# Patient Record
Sex: Male | Born: 1940
Health system: Southern US, Community
[De-identification: ages and names within clinical notes are randomized; demographics above are authoritative.]

## PROBLEM LIST (undated history)

## (undated) DIAGNOSIS — E785 Hyperlipidemia, unspecified: Secondary | ICD-10-CM

## (undated) DIAGNOSIS — E538 Deficiency of other specified B group vitamins: Secondary | ICD-10-CM

## (undated) DIAGNOSIS — K5790 Diverticulosis of intestine, part unspecified, without perforation or abscess without bleeding: Secondary | ICD-10-CM

## (undated) DIAGNOSIS — R0989 Other specified symptoms and signs involving the circulatory and respiratory systems: Secondary | ICD-10-CM

## (undated) DIAGNOSIS — D239 Other benign neoplasm of skin, unspecified: Secondary | ICD-10-CM

## (undated) DIAGNOSIS — I2510 Atherosclerotic heart disease of native coronary artery without angina pectoris: Secondary | ICD-10-CM

## (undated) DIAGNOSIS — K5520 Angiodysplasia of colon without hemorrhage: Secondary | ICD-10-CM

## (undated) DIAGNOSIS — D229 Melanocytic nevi, unspecified: Secondary | ICD-10-CM

## (undated) DIAGNOSIS — E119 Type 2 diabetes mellitus without complications: Secondary | ICD-10-CM

## (undated) DIAGNOSIS — G43109 Migraine with aura, not intractable, without status migrainosus: Secondary | ICD-10-CM

## (undated) DIAGNOSIS — I10 Essential (primary) hypertension: Secondary | ICD-10-CM

## (undated) DIAGNOSIS — I219 Acute myocardial infarction, unspecified: Secondary | ICD-10-CM

## (undated) DIAGNOSIS — T7840XA Allergy, unspecified, initial encounter: Secondary | ICD-10-CM

## (undated) HISTORY — DX: Other specified symptoms and signs involving the circulatory and respiratory systems: R09.89

## (undated) HISTORY — DX: Atherosclerotic heart disease of native coronary artery without angina pectoris: I25.10

## (undated) HISTORY — DX: Migraine with aura, not intractable, without status migrainosus: G43.109

## (undated) HISTORY — DX: Other benign neoplasm of skin, unspecified: D23.9

## (undated) HISTORY — DX: Angiodysplasia of colon without hemorrhage: K55.20

## (undated) HISTORY — PX: CORONARY ARTERY BYPASS GRAFT: SHX141

## (undated) HISTORY — DX: Hyperlipidemia, unspecified: E78.5

## (undated) HISTORY — DX: Deficiency of other specified B group vitamins: E53.8

## (undated) HISTORY — PX: COLONOSCOPY: SHX174

## (undated) HISTORY — DX: Acute myocardial infarction, unspecified: I21.9

## (undated) HISTORY — DX: Allergy, unspecified, initial encounter: T78.40XA

## (undated) HISTORY — PX: CATARACT EXTRACTION: SUR2

## (undated) HISTORY — DX: Diverticulosis of intestine, part unspecified, without perforation or abscess without bleeding: K57.90

## (undated) HISTORY — DX: Essential (primary) hypertension: I10

## (undated) HISTORY — DX: Melanocytic nevi, unspecified: D22.9

---

## 1998-12-21 ENCOUNTER — Encounter: Payer: Self-pay | Admitting: Thoracic Surgery (Cardiothoracic Vascular Surgery)

## 1998-12-21 ENCOUNTER — Inpatient Hospital Stay (HOSPITAL_COMMUNITY): Admission: RE | Admit: 1998-12-21 | Discharge: 1999-01-01 | Payer: Self-pay | Admitting: Interventional Cardiology

## 1998-12-28 ENCOUNTER — Encounter: Payer: Self-pay | Admitting: Thoracic Surgery (Cardiothoracic Vascular Surgery)

## 1998-12-29 ENCOUNTER — Encounter: Payer: Self-pay | Admitting: Thoracic Surgery (Cardiothoracic Vascular Surgery)

## 1998-12-30 ENCOUNTER — Encounter: Payer: Self-pay | Admitting: Thoracic Surgery (Cardiothoracic Vascular Surgery)

## 1999-02-01 ENCOUNTER — Encounter (HOSPITAL_COMMUNITY): Admission: RE | Admit: 1999-02-01 | Discharge: 1999-05-02 | Payer: Self-pay | Admitting: Family Medicine

## 2000-01-31 DIAGNOSIS — I219 Acute myocardial infarction, unspecified: Secondary | ICD-10-CM

## 2000-01-31 HISTORY — DX: Acute myocardial infarction, unspecified: I21.9

## 2001-05-21 ENCOUNTER — Encounter: Payer: Self-pay | Admitting: Emergency Medicine

## 2001-05-21 ENCOUNTER — Emergency Department (HOSPITAL_COMMUNITY): Admission: EM | Admit: 2001-05-21 | Discharge: 2001-05-21 | Payer: Self-pay | Admitting: Emergency Medicine

## 2004-05-02 ENCOUNTER — Encounter: Admission: RE | Admit: 2004-05-02 | Discharge: 2004-07-31 | Payer: Self-pay | Admitting: Family Medicine

## 2004-12-09 ENCOUNTER — Encounter: Admission: RE | Admit: 2004-12-09 | Discharge: 2004-12-09 | Payer: Self-pay | Admitting: Emergency Medicine

## 2005-06-08 ENCOUNTER — Ambulatory Visit: Payer: Self-pay | Admitting: Internal Medicine

## 2005-06-12 ENCOUNTER — Ambulatory Visit: Payer: Self-pay | Admitting: Pulmonary Disease

## 2005-07-10 ENCOUNTER — Ambulatory Visit: Payer: Self-pay | Admitting: Pulmonary Disease

## 2005-07-13 ENCOUNTER — Encounter (INDEPENDENT_AMBULATORY_CARE_PROVIDER_SITE_OTHER): Payer: Self-pay | Admitting: Specialist

## 2005-07-13 ENCOUNTER — Ambulatory Visit: Payer: Self-pay | Admitting: Internal Medicine

## 2007-03-08 IMAGING — CT CT ABDOMEN W/O CM
1 series · 15 of 32 positions shown, 19 images · IV contrast (agent unspecified)
Comparison: none

CLINICAL DATA: Right flank and back pain.
 ABDOMEN CT WITHOUT CONTRAST:
TECHNIQUE: Multidetector CT imaging of the abdomen was performed following the standard protocol without IV contrast.
 No comparison.
TECHNIQUE: Multidetector CT imaging of the pelvis was performed following the standard protocol without IV contrast.

[Series 2: renal stone · axial · 0.70mm/px · z∈[-386,-50]mm · 15 of 75 slices shown, 19 images]
[im 5/75  soft-tissue]
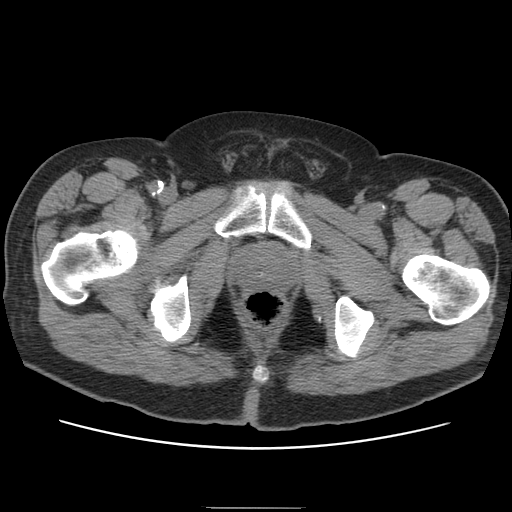
[im 5/75  bone]
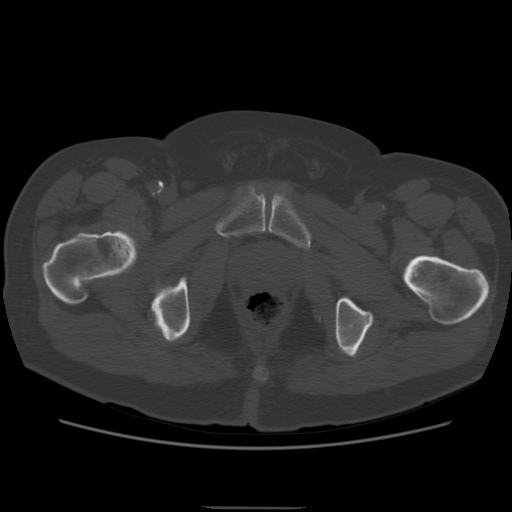
[im 10/75  soft-tissue]
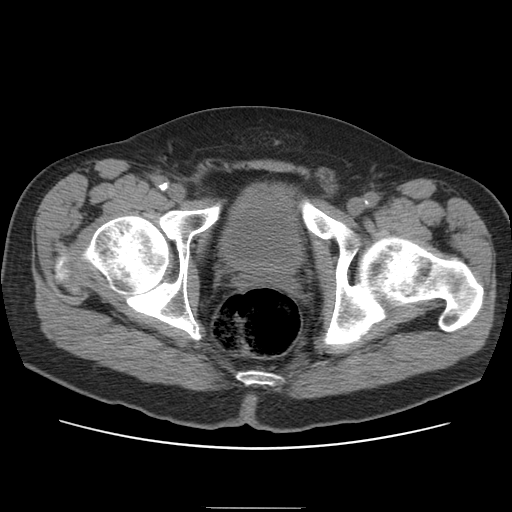
[im 15/75  soft-tissue]
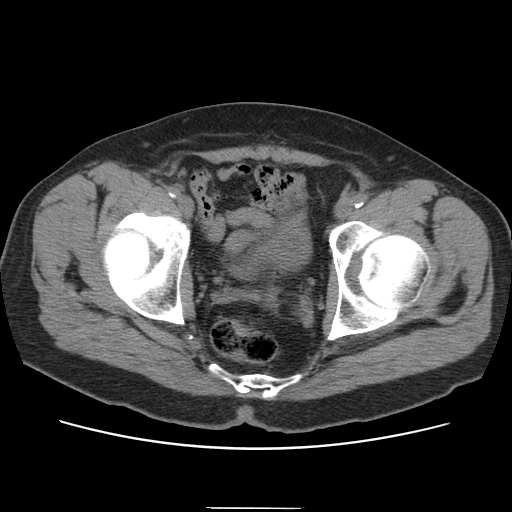
[im 22/75  soft-tissue]
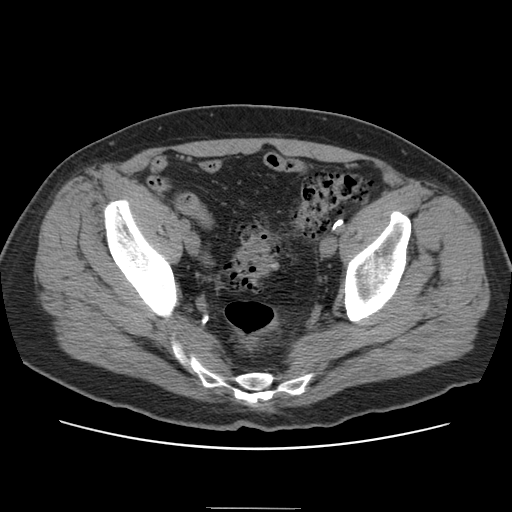
[im 27/75  soft-tissue]
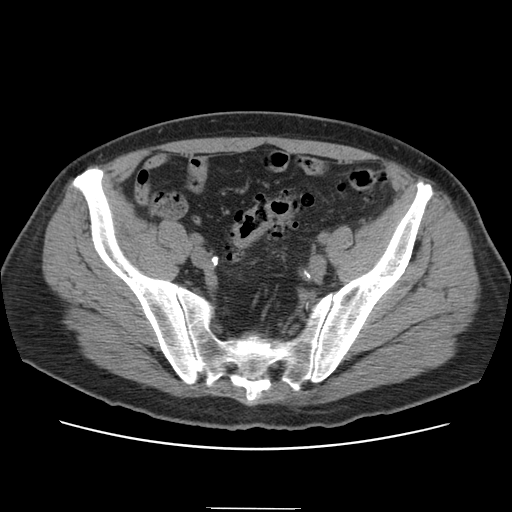
[im 32/75  soft-tissue]
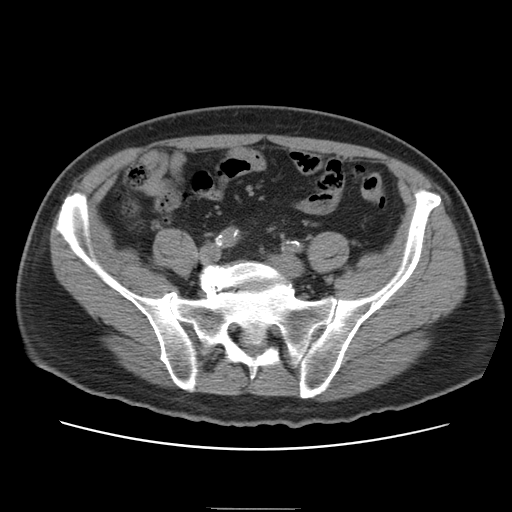
[im 39/75  soft-tissue]
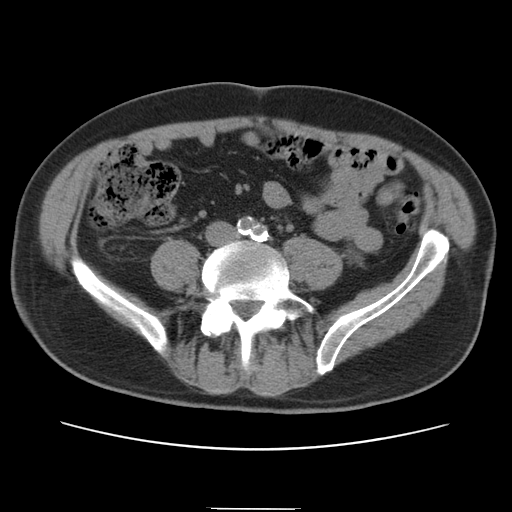
[im 43/75  soft-tissue]
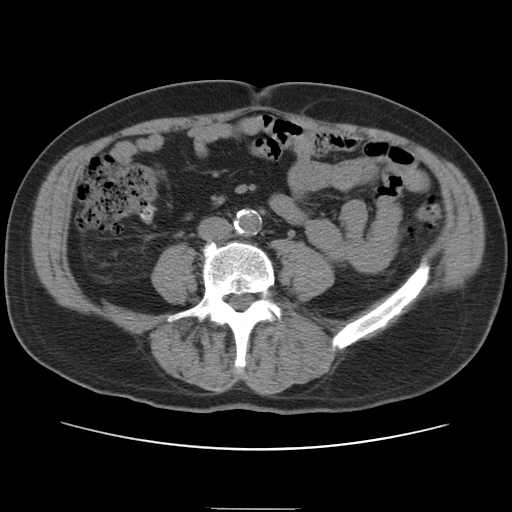
[im 48/75  soft-tissue]
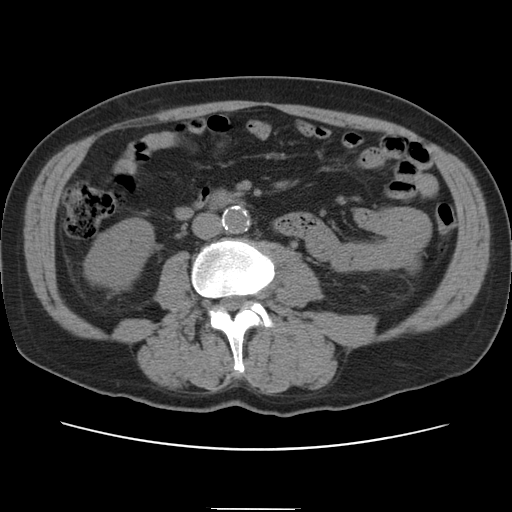
[im 48/75  bone]
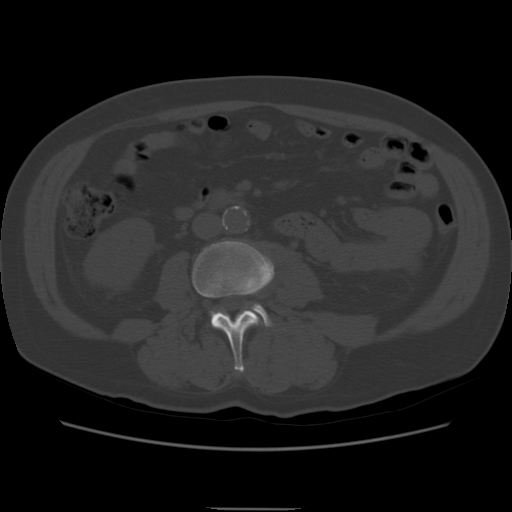
[im 53/75  soft-tissue]
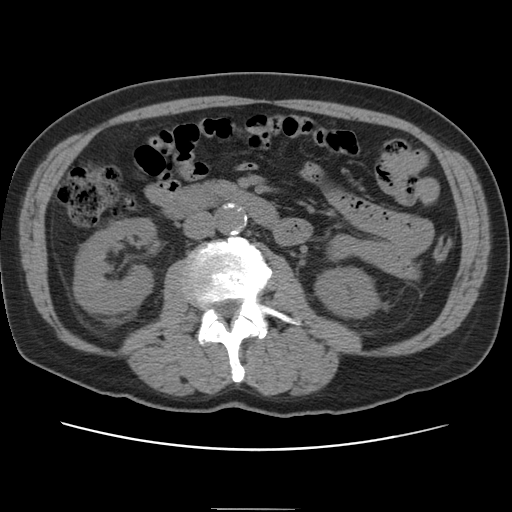
[im 60/75  soft-tissue]
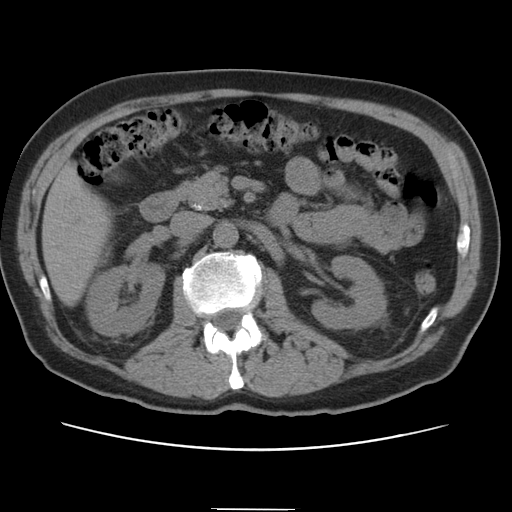
[im 65/75  soft-tissue]
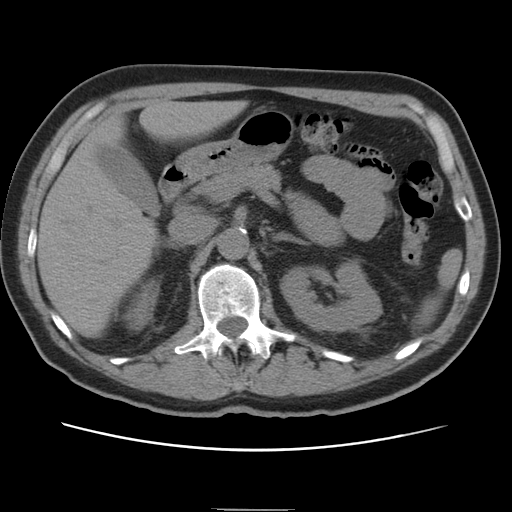
[im 65/75  lung]
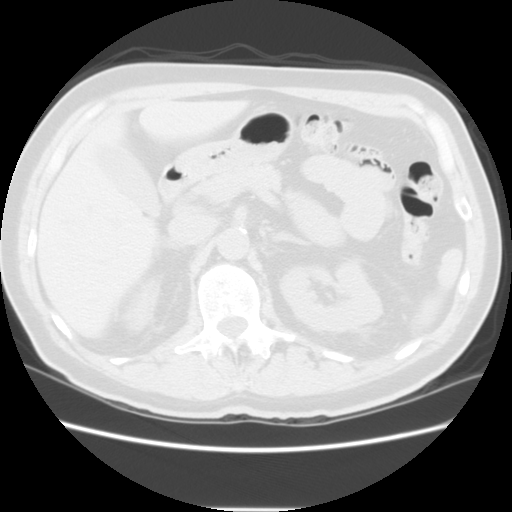
[im 67/75  lung]
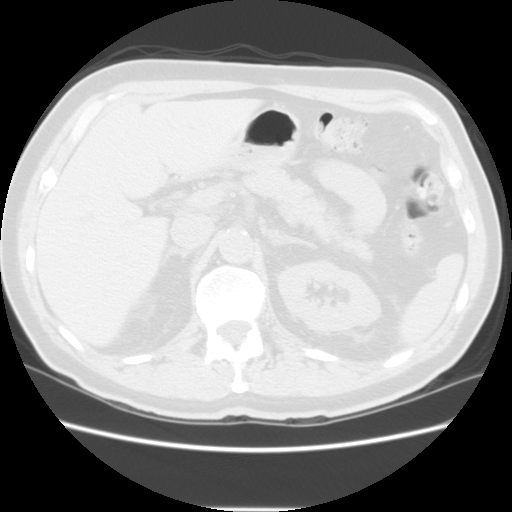
[im 70/75  soft-tissue]
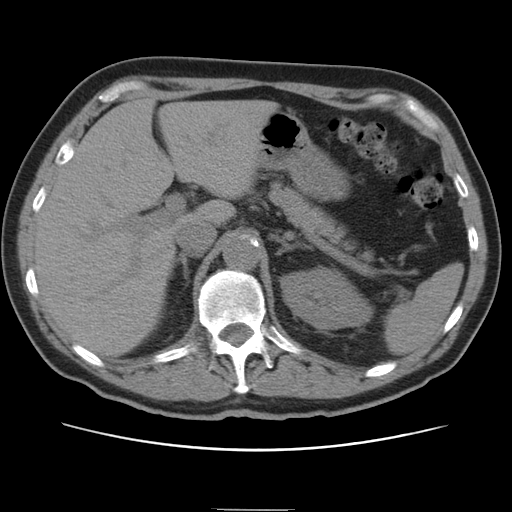
[im 70/75  lung]
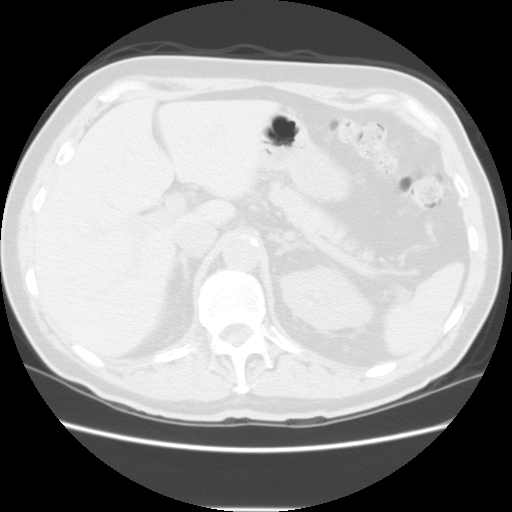
[im 72/75  lung]
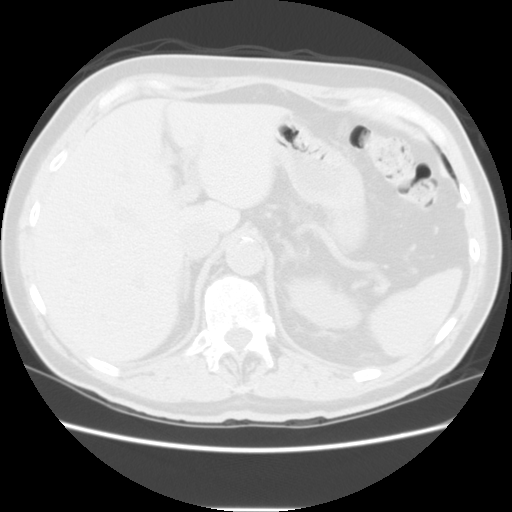

[15 of 32 positions shown; findings below may reference images not displayed]

FINDINGS: There is no evidence of renal calculi or hydronephrosis.  There is no evidence of perinephric fluid or acute inflammatory changes.
 The other abdominal parenchymal organs are unremarkable in appearance on this noncontrast study.  No abnormal soft tissue masses are identified, and there is no evidence of inflammatory process or abnormal fluid collections.  Slight to moderate atheromatous vascular calcification, including bilateral renal artery ostia with maximum abdominal aortic diameter measuring 2.5 cm wide (image 22).  Right common iliac artery measures 1.5 cm wide (image 43) and left common iliac artery measures 1.1 cm wide with dense calcification (image 41).  Slight dextroscoliosis with slight degenerative disk disease, L5-S1 and moderate right facet degenerative joint disease at this level is seen.  Specifically, the appendix appears normal.
IMPRESSION: 1.  Atheromatous vascular calcification, as described.
 2.  Slight scoliosis with degenerative disk and facet change, L5-S1.
 3.  Otherwise negative. 
 PELVIS CT WITHOUT CONTRAST:
FINDINGS: Moderate atheromatous vascular calcification is seen.  No ureterectasis nor urinary tract calcification is noted.  Moderate descending and sigmoid colonic diverticulosis without inflammatory change is seen.
IMPRESSION: 1.   Moderate atheromatous vascular calcification and diverticulosis.
 2.  Otherwise negative.

## 2009-05-17 ENCOUNTER — Ambulatory Visit: Payer: Self-pay | Admitting: Surgery

## 2010-06-14 NOTE — Procedures (Signed)
CAROTID DUPLEX EXAM   INDICATION:  Carotid bruits.   HISTORY:  Diabetes:  Yes  Cardiac:  MI  Hypertension:  Yes  Smoking:  Previous  Previous Surgery:  No  CV History:  No  Amaurosis Fugax No, Paresthesias No, Hemiparesis No                                       RIGHT             LEFT  Brachial systolic pressure:         150               148  Brachial Doppler waveforms:         WNL               WNL  Vertebral direction of flow:        Antegrade         Antegrade  DUPLEX VELOCITIES (cm/sec)  CCA peak systolic                   69                87  ECA peak systolic                   196               90  ICA peak systolic                   108               78  ICA end diastolic                   27                20  PLAQUE MORPHOLOGY:                  Heterogeneous     Heterogeneous  PLAQUE AMOUNT:                      Mild              Mild  PLAQUE LOCATION:                    Bulb, ICA, ECA    ICA, ECA   IMPRESSION:  1. Bilateral internal carotid artery suggests 20% to 39% stenosis.  2. Right external carotid artery stenosis.  3. Antegrade flow in bilateral vertebrals.   ___________________________________________  V. Charlena Cross, MD   CB/MEDQ  D:  05/18/2009  T:  05/18/2009  Job:  [id number]

## 2010-06-17 NOTE — Discharge Summary (Signed)
[place]  Patient:    KAIDEN PECH                       MRN: [id number] Adm. Date:  [id number] Disc. Date: 01/01/99 Attending:  Charlett Lango Dictator:   Carlye Grippe. CC:         Salvatore Decent. Dorris Fetch, M.D.             Darci Needle, M.D.                           Discharge Summary  HISTORY OF PRESENT ILLNESS:  This is a 70 year old male who, on December 06, 1998, while hunting in [place], experienced an inferior wall myocardial infarction. His initial EKG showed no acute changes, but enzymes returned and showed mild abnormalities.  He was subsequently hospitalized for approximately 36 hours, and was discharged on aspirin, beta blocker, and instructions to follow up with his  physician.  He had no further symptoms of chest pain, and was seen by Dr. Katrinka Blazing in consultation and recommended for cardiac catheterization.  This was performed on December 21, 1998, and showed severe coronary artery disease, including an 80-90% LAD lesion, an 80% diagonal 1 lesion, an 80% diffusely diseased obtuse marginal 1 lesion, an eccentric 75% lesion in the obtuse marginal 2, and a 50% lesion in the obtuse marginal 3.  The right coronary artery showed segmental stenosis of approximately 90% in the mid portion of the artery.  The patient had normal left ventricular function with an ejection fraction estimated at 60%.  He was subsequently referred to Charlett Lango, M.D., who evaluated the patient and his studies, and agreed with recommendations for proceeding forward with elective coronary artery bypass grafting, and he was admitted this hospitalization for the procedure.  PAST MEDICAL HISTORY: 1. Cardiac dysrhythmias. 2. History of degenerative joint disease. 3. History of myocardial infarction December 06, 1998, non-Q-wave. 4. History of hypertension x 3-4 years. 5. History of non-insulin-dependent diabetes mellitus x 10  years.  PAST SURGICAL HISTORY:  None.  ADMISSION MEDICATIONS: 1. Glucophage 850 mg b.i.d. 2. Glucotrol XL 1 tablet b.i.d. 3. Univasc 7.5 mg q.d. 4. Lopressor 50 mg q.d. 5. Aspirin 1 q.d. 6. Nitroglycerin p.r.n.  SOCIAL HISTORY, FAMILY HISTORY, REVIEW OF SYMPTOMS, PHYSICAL EXAMINATION: Please see the history and physical done at the time of admission.  HOSPITAL COURSE:  The patient was admitted electively on December 28, 1998, and  underwent the following procedure:  Coronary artery bypass grafting x 5.  The following grafts were placed: 1. Left internal mammary artery to the LAD. 2. Saphenous vein graft to the first diagonal coronary artery. 3. Sequential saphenous vein graft to obtuse marginal 1 and obtuse marginal 2. 4. Saphenous vein graft to the posterior descending coronary artery.  Cross-clamp time time was 57 minutes, total pump time was 113 minutes.  The patient tolerated the procedure well, and was taken to the surgical intensive care unit in stable condition.  POSTOPERATIVE HOSPITAL COURSE:  The patient has done quite well postoperatively. He has maintained stable hemodynamics throughout.  Cardiac index on postoperative day #1 was greater than 2.  All routine lines and monitoring devices were discontinued in a normal fashion without difficulties.  He was started on a course of diuresis.  His capillary blood glucoses were monitored per routine protocols, and they were stable on current regimen.  Laboratory values have remained  stable. His most recent hemoglobin and hematocrit dated December 31, 1998, are 11.4 and 4.4 respectively.  Electrolytes, BUN, and creatinine have remained stable.  Cardiac  rhythms have remained stable, with no ectopy or dysrhythmias.  Examination is stable, with wound showing evidence of good healing without signs of infection.  Oxygen has been weaned, and he maintains good saturations on room air.  He has tolerated routine advancement  in cardiac rehabilitation phase 1 modalities. Overall, he is felt to be quite stable for tentative discharge on the morning of January 01, 1999, pending morning round re-evaluation.  DISCHARGE MEDICATIONS: 1. Lopressor 50 mg 1/2 tablet q.12h. 2. Coated aspirin 325 mg daily. 3. Glucotrol XL 5 mg 1 before meals b.i.d. 4. Multivitamin with iron 1 p.o. daily. 5. Darvocet-N 100 1-2 q.4h. p.r.n. 6. Glucophage 850 mg b.i.d.  DISCHARGE INSTRUCTIONS:  The patient will receive written instructions in regard to medications, activity, diet, wound care, and follow-up.  FOLLOW-UP:  Will include Dr. Katrinka Blazing in two weeks, Dr. Dorris Fetch on Wednesday, January 19, 1999, at 10 a.m.  FINAL DIAGNOSES: 1. Status post non-Q-wave myocardial infarction November 2000, with severe three    vessel disease by cardiac catheterization. 2. Hypertension. 3. Adult onset diabetes mellitus. 4. Degenerative joint disease. DD:  12/31/98 TD:  01/01/99 Job: [id number]

## 2010-06-17 NOTE — Op Note (Signed)
[place]. [facility]  Patient:    Leonard Garcia                       MRN: [id number] Proc. Date: 12/28/98 Adm. Date:  [id number] Attending:  Charlett Lango CC:         Darci Needle, M.D.             Carolyne Fiscal, M.D.                           Operative Report  PREOPERATIVE DIAGNOSIS:  Three-vessel coronary disease status post myocardial infarction.  POSTOPERATIVE DIAGNOSIS:  Three-vessel coronary disease status post myocardial infarction.  OPERATION PERFORMED:  Median sternotomy, extracorporeal circulation, coronary artery bypass grafting x 5 (left internal mammary artery to left anterior descending, saphenous vein graft to first diagonal, sequential saphenous vein graft to the first and second obtuse marginal, saphenous vein graft to posterior descending).  SURGEON:  Salvatore Decent. Dorris Fetch, M.D.  ASSISTANT:  Lynnda Shields, P.A.  ANESTHESIA:  General.  FINDINGS:  Good quality conduits, good quality targets except posterior descending, which was fair quality.  Overall good left ventricular function.  CLINICAL NOTE:  Mr. Menz is a 70 year old gentleman with a history of non-insulin-dependent diabetes.  He was hunting in [place] when he developed a case of severe indigestion.  He had persistent pain and was seen in the emergency room and subsequently ruled in for myocardial infarction by enzymes.  Patient then returned to [facility] and underwent cardiac catheterization on December 21, 1998. Cardiac catheterization revealed a 90% stenosis in the LAD as well as 80% stenosis in the OM1 and 75% stenosis in OM2 and a 90% midright coronary stenosis.  The patient had overall preserved left ventricular function.  He was referred for coronary artery bypass grafting.  The indications, risks, and benefits of coronary artery bypass grafting as well as alternative treatments were discussed with the patient.  He understood the risks  as outlined in the patients chart and agreed o proceed.  DESCRIPTION OF PROCEDURE:  Mr. Wassenaar was brought to the preop holding area on December 28, 1998.  Lines were placed to monitor arterial, central venous and pulmonary arterial pressure.  EKG leads were placed for continuous telemetry. he patient was taken to the operating room, anesthetized and intubated. A Foley catheter was placed.  Intravenous antibiotics were administered.  The chest, abdomen and legs were prepped and draped in the usual fashion.  A median sternotomy was performed.  Simultaneously an incision was made in the medial aspect of the  right leg and the greater saphenous vein was harvested in the standard fashion.  The saphenous vein was a good quality conduit.  The left internal mammary artery likewise was harvested in standard fashion.  Large branches were doubly clipped and divided.  Small branches were clipped on the mammary side and cauterized on the  chest wall side.  The patient was fully heparinized prior to dividing the distal end of the mammary artery.  There was good flow through the cut end of the vessel. The mammary was placed in a papaverine soaked sponge after ensuring that all side branches had been clipped.  The pericardium was opened.  The ascending aorta was palpated.  There was no palpable atheromata.  The aorta was cannulated via concentric 2-0 Ethibond pledgeted pursestring sutures.  A dual stage venous cannula was placed via pursestring suture in  the right atrial appendage.  Cardiopulmonary bypass was instituted and the patient was cooled to 32 degrees Celsius.  The coronary arteries were inspected and anastomotic sites chosen.  The conduits were inspected and cut to length.  A foam pad was placed in the pericardium to protect the left phrenic nerve.  A temperature probe was placed in the myocardial septum and a cardioplegia was placed in the ascending aorta.  The aorta was  crossclamped.  The left ventricle was emptied via the aortic root  vent.  Cardiac arrest then was achieved with a combination of cold antegrade cardioplegia and topical iced saline.  After achieving complete diastolic arrest and myocardial septal cooling, the following distal anastomoses were performed.  First a reversed saphenous vein graft was placed to posterior descending branch of the right coronary.  Angiographically, the distal right coronary appeared normal but there was palpable disease as well as at the origin of the posterior descending.  The graft was placed distal to any palpable or visible atherosclerosis.  The posterior descending was a 1.5 mm fair quality coronary beyond this area but a probe did pass easily proximally.  At the completion of he anastomosis, there was excellent flow with flushing of the graft and additional  cardioplegia was administered down the vein graft.  Next, a reversed saphenous vein was placed sequentially to the first and second  obtuse marginal branches of the left circumflex coronary artery, both these vessels had tight stenosis in their proximal portions.  The first obtuse marginal was a  1.5 mm fair quality artery at the site of the anastomosis.  It was mildly diffusely diseased.  The anastomosis was performed off a branch of the vein graft using a  running 7-0 Prolene suture.  An end-to-side anastomosis then was constructed using the same segment of saphenous vein to the second obtuse marginal branch of the eft circumflex.  This was a 2 mm good quality target.  This anastomosis likewise was performed with a running 7-0 Prolene suture.  At completion of this second anastomosis, additional cardioplegia was administered down the two vein grafts.  Next, a reversed saphenous vein graft was placed end-to-side to the first diagonal branch of the LAD.  The first diagonal branch was a 1.5 mm good quality target.  The saphenous vein was  of good quality and anastomosis was performed with a running 7-0 Prolene suture.  Again the anastomosis was probed proximally with a 1.5 mm  probe prior to tying the suture and there was good flow through the anastomosis. Additional cardioplegia was administered down the three vein grafts.  Next the left internal mammary artery was brought through a window in the pericardium anterior to the left phrenic nerve.  The distal end was spatulated nd was anastomosed end-to-side to the distal LAD, which was a 1.8 mm good quality target.  The mammary artery also was of good quality and the anastomosis was performed with a running 8-0 Prolene suture.  At the completion of the mammary o LAD anastomosis.  The bulldog clamp was removed from the mammary artery and immediate and rapid septal rewarming was noted.  The mammary pedicle was tacked to the epicardial surface of the heart with interrupted 6-0 Prolene sutures after inspecting the anastomosis for hemostasis.  The aortic crossclamp was removed.  Total crossclamp time was 57 minutes.  A single defibrillation was required.  A partial occlusion clamp was placed on the ascending aorta.  The vein grafts were cut to length.  Proximal vein  graft anastomoses were performed to 4.4 mm punch aortotomies with running 6-0 Prolene sutures.  At completion of the final proximal anastomoses, the patient was placed head down.  Air was allowed to vent as the partial clamp was removed prior to tying the final anastomotic suture.  The vein grafts then were aspirated to remove any residual air.  The bulldog clamps were  removed.  All proximal and distal anastomoses were inspected for hemostasis. The patient was rewarmed.  Epicardial wires were placed on the right ventricle and right atrium.  The patient was weaned from cardiopulmonary bypass when the core  temperature reached 37 degrees Celsius.  Total bypass time was 113 minutes. The patient was weaned  from bypass without difficulty on no inotropic support and had an initial cardiac index of greater than 2 L per minute and remained hemodynamically stable throughout the post bypass course.  A test dose of protamine was administered.  It was well tolerated.  The atrial nd aortic cannulae were removed.  The remainder of the protamine and an additional  dose of antibiotics were administerd.  The chest was irrigated with 1 L of warm  normal saline containing 1 gm of vancomycin.  Hemostasis was achieved.  A left pleural and two mediastinal chest tubes were placed through separate subcostal incisions and secured with #1 silk sutures.  The pericardium was reapproximated  over the ascending aorta and the base of the heart with interrupted 3-0 silk sutures.  The sternum was closed with stainless steel wires.  The pectoralis fascia was closed with a running #1 Vicryl suture.  In both the chest and leg incisions, the subcutaneous tissue was closed with a running 2-0 Vicryl suture and the skin was closed with 3-0 Vicryl subcuticular suture.  All sponge, needle and instrument counts were verified correct at the end of the procedure.  The patient remained  hemodynamically stable and was taken from the operating table to the surgical intensive care unit intubated in stable condition. DD:  12/28/98 TD:  12/29/98 Job: [id number]

## 2010-06-17 NOTE — Cardiovascular Report (Signed)
[place]. [facility]  Patient:    Leonard Garcia                       MRN: [id number] Proc. Date: 12/21/98 Adm. Date:  [id number] Attending:  Charlett Lango CC:         CVTS             Carolyne Fiscal, M.D.                        Cardiac Catheterization  CINE NUMBER:  [id number]  INDICATION:  Recent inferior myocardial infarction.  PROCEDURES PERFORMED: 1. Left heart catheterization. 2. Selective coronary angiography. 3. Left ventriculography.  DESCRIPTION OF PROCEDURE:  After informed consent, a 6-French sheath was inserted into the right femoral artery using the modified Seldinger technique.  A 6-French A2 multipurpose catheter was used for hemodynamic recordings, left ventriculography, and selective left and right coronary angiography.  We also used a #4 6-French left Judkins catheter for left coronary angiography.  The patient  tolerated the procedure without complications.  RESULTS:    I. HEMODYNAMIC DATA:       a. Aortic pressure:  97/49 mmHg.       b. Left ventricular pressure:  97/10 mmHg.   II. LEFT VENTRICULOGRAPHY:  The left ventricle was normal in size and demonstrated      overall normal left ventricular systolic contractility.  The ejection fraction      was 62%.  No mitral regurgitation was noted.  III. CORONARY ANGIOGRAPHY:       a. Left main coronary:  The left main coronary artery is short and bifurcates          early.  There is essentially a dual ostium for the left main and left          circumflex.  No significant left main obstruction is noted.       b. Left anterior descending coronary:  The LAD is a large vessel that          contains proximal heavy calcification.  Significant diffuse disease is          noted after the first diagonal origin.  There is 80% stenosis of the LAD          near the origin of the first septal perforator.  The LAD is large and          reaches the left ventricular apex.  The first  diagonal contains an 80% mid          vessel stenosis.  This vessel is also moderate to large in size.       c. Circumflex artery:  The circumflex artery gives origin to three obtuse          marginal branches.  The first obtuse marginal contains diffuse disease          with 80% to 90% proximal stenosis.  The second obtuse marginal is large          in caliber and also large in distribution and contains an eccentric 85%          stenosis proximally.  The third obtuse marginal arises distally and          contains an eccentric 50% to 70% stenosis.  This obtuse marginal branch is          small.       d. Right coronary artery:  The right coronary artery is moderate in size.  There is 85% to 90% mid vessel stenosis.  There is segmental narrowing          within this region.  This appears to be the culprit lesion for the          patients recent inferior infarction.  CONCLUSIONS: 1. Severe three-vessel coronary artery disease with high-grade segmental stenosis    in the mid right coronary artery.  This is the culprit lesion for the patients    recent inferior infarction.  The left anterior descending artery contains    significant complex proximal and mid vessel disease.  The first diagonal is lso    severely diseased in the mid segment and each of the three obtuse marginal    branches contain significant obstruction. 2. Overall normal left ventricular function.  RECOMMENDATIONS:  Coronary surgery seems to be the most acceptable approach to revascularization in this diabetic patient with multivessel disease. DD:  12/21/98 TD:  12/21/98 Job: [id number]

## 2011-02-06 DIAGNOSIS — Z125 Encounter for screening for malignant neoplasm of prostate: Secondary | ICD-10-CM | POA: Diagnosis not present

## 2011-02-06 DIAGNOSIS — E1159 Type 2 diabetes mellitus with other circulatory complications: Secondary | ICD-10-CM | POA: Diagnosis not present

## 2011-02-06 DIAGNOSIS — I10 Essential (primary) hypertension: Secondary | ICD-10-CM | POA: Diagnosis not present

## 2011-02-06 DIAGNOSIS — E785 Hyperlipidemia, unspecified: Secondary | ICD-10-CM | POA: Diagnosis not present

## 2011-02-23 DIAGNOSIS — I7389 Other specified peripheral vascular diseases: Secondary | ICD-10-CM | POA: Diagnosis not present

## 2011-02-23 DIAGNOSIS — E875 Hyperkalemia: Secondary | ICD-10-CM | POA: Diagnosis not present

## 2011-02-23 DIAGNOSIS — E1159 Type 2 diabetes mellitus with other circulatory complications: Secondary | ICD-10-CM | POA: Diagnosis not present

## 2011-02-23 DIAGNOSIS — Z0000 Encounter for general adult medical examination without abnormal findings: Secondary | ICD-10-CM | POA: Diagnosis not present

## 2011-04-13 DIAGNOSIS — I7389 Other specified peripheral vascular diseases: Secondary | ICD-10-CM | POA: Diagnosis not present

## 2011-04-13 DIAGNOSIS — I10 Essential (primary) hypertension: Secondary | ICD-10-CM | POA: Diagnosis not present

## 2011-04-13 DIAGNOSIS — E1159 Type 2 diabetes mellitus with other circulatory complications: Secondary | ICD-10-CM | POA: Diagnosis not present

## 2011-07-12 DIAGNOSIS — I2510 Atherosclerotic heart disease of native coronary artery without angina pectoris: Secondary | ICD-10-CM | POA: Diagnosis not present

## 2011-07-12 DIAGNOSIS — E1159 Type 2 diabetes mellitus with other circulatory complications: Secondary | ICD-10-CM | POA: Diagnosis not present

## 2011-07-12 DIAGNOSIS — I7389 Other specified peripheral vascular diseases: Secondary | ICD-10-CM | POA: Diagnosis not present

## 2011-07-12 DIAGNOSIS — I10 Essential (primary) hypertension: Secondary | ICD-10-CM | POA: Diagnosis not present

## 2011-08-15 DIAGNOSIS — E1159 Type 2 diabetes mellitus with other circulatory complications: Secondary | ICD-10-CM | POA: Diagnosis not present

## 2011-08-15 DIAGNOSIS — I10 Essential (primary) hypertension: Secondary | ICD-10-CM | POA: Diagnosis not present

## 2011-08-15 DIAGNOSIS — I7389 Other specified peripheral vascular diseases: Secondary | ICD-10-CM | POA: Diagnosis not present

## 2011-09-22 DIAGNOSIS — H2510 Age-related nuclear cataract, unspecified eye: Secondary | ICD-10-CM | POA: Diagnosis not present

## 2011-09-22 DIAGNOSIS — E1139 Type 2 diabetes mellitus with other diabetic ophthalmic complication: Secondary | ICD-10-CM | POA: Diagnosis not present

## 2011-10-19 DIAGNOSIS — I10 Essential (primary) hypertension: Secondary | ICD-10-CM | POA: Diagnosis not present

## 2011-10-19 DIAGNOSIS — Z23 Encounter for immunization: Secondary | ICD-10-CM | POA: Diagnosis not present

## 2011-10-19 DIAGNOSIS — I2510 Atherosclerotic heart disease of native coronary artery without angina pectoris: Secondary | ICD-10-CM | POA: Diagnosis not present

## 2011-10-19 DIAGNOSIS — E785 Hyperlipidemia, unspecified: Secondary | ICD-10-CM | POA: Diagnosis not present

## 2011-10-19 DIAGNOSIS — E1159 Type 2 diabetes mellitus with other circulatory complications: Secondary | ICD-10-CM | POA: Diagnosis not present

## 2012-02-08 DIAGNOSIS — E1159 Type 2 diabetes mellitus with other circulatory complications: Secondary | ICD-10-CM | POA: Diagnosis not present

## 2012-02-08 DIAGNOSIS — I10 Essential (primary) hypertension: Secondary | ICD-10-CM | POA: Diagnosis not present

## 2012-02-08 DIAGNOSIS — I7389 Other specified peripheral vascular diseases: Secondary | ICD-10-CM | POA: Diagnosis not present

## 2012-02-08 DIAGNOSIS — I2510 Atherosclerotic heart disease of native coronary artery without angina pectoris: Secondary | ICD-10-CM | POA: Diagnosis not present

## 2012-06-13 DIAGNOSIS — Z6825 Body mass index (BMI) 25.0-25.9, adult: Secondary | ICD-10-CM | POA: Diagnosis not present

## 2012-06-13 DIAGNOSIS — E1159 Type 2 diabetes mellitus with other circulatory complications: Secondary | ICD-10-CM | POA: Diagnosis not present

## 2012-06-13 DIAGNOSIS — I2510 Atherosclerotic heart disease of native coronary artery without angina pectoris: Secondary | ICD-10-CM | POA: Diagnosis not present

## 2012-06-13 DIAGNOSIS — I7389 Other specified peripheral vascular diseases: Secondary | ICD-10-CM | POA: Diagnosis not present

## 2012-06-13 DIAGNOSIS — I10 Essential (primary) hypertension: Secondary | ICD-10-CM | POA: Diagnosis not present

## 2012-10-09 DIAGNOSIS — I7389 Other specified peripheral vascular diseases: Secondary | ICD-10-CM | POA: Diagnosis not present

## 2012-10-09 DIAGNOSIS — R209 Unspecified disturbances of skin sensation: Secondary | ICD-10-CM | POA: Diagnosis not present

## 2012-10-09 DIAGNOSIS — E1159 Type 2 diabetes mellitus with other circulatory complications: Secondary | ICD-10-CM | POA: Diagnosis not present

## 2012-10-09 DIAGNOSIS — I10 Essential (primary) hypertension: Secondary | ICD-10-CM | POA: Diagnosis not present

## 2012-10-09 DIAGNOSIS — Z23 Encounter for immunization: Secondary | ICD-10-CM | POA: Diagnosis not present

## 2012-10-09 DIAGNOSIS — E785 Hyperlipidemia, unspecified: Secondary | ICD-10-CM | POA: Diagnosis not present

## 2012-10-09 DIAGNOSIS — R0989 Other specified symptoms and signs involving the circulatory and respiratory systems: Secondary | ICD-10-CM | POA: Diagnosis not present

## 2012-10-09 DIAGNOSIS — I2510 Atherosclerotic heart disease of native coronary artery without angina pectoris: Secondary | ICD-10-CM | POA: Diagnosis not present

## 2012-10-09 DIAGNOSIS — Z6827 Body mass index (BMI) 27.0-27.9, adult: Secondary | ICD-10-CM | POA: Diagnosis not present

## 2012-10-10 ENCOUNTER — Other Ambulatory Visit (INDEPENDENT_AMBULATORY_CARE_PROVIDER_SITE_OTHER): Payer: Medicare Other | Admitting: Vascular Surgery

## 2012-10-10 ENCOUNTER — Encounter: Payer: Self-pay | Admitting: Internal Medicine

## 2012-10-10 DIAGNOSIS — R0989 Other specified symptoms and signs involving the circulatory and respiratory systems: Secondary | ICD-10-CM | POA: Diagnosis not present

## 2012-11-14 DIAGNOSIS — Z1331 Encounter for screening for depression: Secondary | ICD-10-CM | POA: Diagnosis not present

## 2012-11-14 DIAGNOSIS — E1159 Type 2 diabetes mellitus with other circulatory complications: Secondary | ICD-10-CM | POA: Diagnosis not present

## 2012-11-14 DIAGNOSIS — R0989 Other specified symptoms and signs involving the circulatory and respiratory systems: Secondary | ICD-10-CM | POA: Diagnosis not present

## 2012-11-14 DIAGNOSIS — I10 Essential (primary) hypertension: Secondary | ICD-10-CM | POA: Diagnosis not present

## 2012-11-14 DIAGNOSIS — Z6827 Body mass index (BMI) 27.0-27.9, adult: Secondary | ICD-10-CM | POA: Diagnosis not present

## 2012-11-14 DIAGNOSIS — I7389 Other specified peripheral vascular diseases: Secondary | ICD-10-CM | POA: Diagnosis not present

## 2012-11-14 DIAGNOSIS — I2510 Atherosclerotic heart disease of native coronary artery without angina pectoris: Secondary | ICD-10-CM | POA: Diagnosis not present

## 2013-02-03 DIAGNOSIS — IMO0001 Reserved for inherently not codable concepts without codable children: Secondary | ICD-10-CM | POA: Diagnosis not present

## 2013-02-03 DIAGNOSIS — E785 Hyperlipidemia, unspecified: Secondary | ICD-10-CM | POA: Diagnosis not present

## 2013-02-03 DIAGNOSIS — Z125 Encounter for screening for malignant neoplasm of prostate: Secondary | ICD-10-CM | POA: Diagnosis not present

## 2013-02-07 DIAGNOSIS — I7389 Other specified peripheral vascular diseases: Secondary | ICD-10-CM | POA: Diagnosis not present

## 2013-02-07 DIAGNOSIS — I10 Essential (primary) hypertension: Secondary | ICD-10-CM | POA: Diagnosis not present

## 2013-02-07 DIAGNOSIS — Z0000 Encounter for general adult medical examination without abnormal findings: Secondary | ICD-10-CM | POA: Diagnosis not present

## 2013-02-07 DIAGNOSIS — I2510 Atherosclerotic heart disease of native coronary artery without angina pectoris: Secondary | ICD-10-CM | POA: Diagnosis not present

## 2013-02-07 DIAGNOSIS — N529 Male erectile dysfunction, unspecified: Secondary | ICD-10-CM | POA: Diagnosis not present

## 2013-02-07 DIAGNOSIS — E1159 Type 2 diabetes mellitus with other circulatory complications: Secondary | ICD-10-CM | POA: Diagnosis not present

## 2013-02-07 DIAGNOSIS — Z6827 Body mass index (BMI) 27.0-27.9, adult: Secondary | ICD-10-CM | POA: Diagnosis not present

## 2013-02-07 DIAGNOSIS — E785 Hyperlipidemia, unspecified: Secondary | ICD-10-CM | POA: Diagnosis not present

## 2013-02-07 DIAGNOSIS — Z125 Encounter for screening for malignant neoplasm of prostate: Secondary | ICD-10-CM | POA: Diagnosis not present

## 2013-02-21 DIAGNOSIS — Z1212 Encounter for screening for malignant neoplasm of rectum: Secondary | ICD-10-CM | POA: Diagnosis not present

## 2013-08-07 DIAGNOSIS — E785 Hyperlipidemia, unspecified: Secondary | ICD-10-CM | POA: Diagnosis not present

## 2013-08-07 DIAGNOSIS — I7389 Other specified peripheral vascular diseases: Secondary | ICD-10-CM | POA: Diagnosis not present

## 2013-08-07 DIAGNOSIS — E1159 Type 2 diabetes mellitus with other circulatory complications: Secondary | ICD-10-CM | POA: Diagnosis not present

## 2013-08-07 DIAGNOSIS — C4491 Basal cell carcinoma of skin, unspecified: Secondary | ICD-10-CM | POA: Diagnosis not present

## 2013-08-07 DIAGNOSIS — Z6826 Body mass index (BMI) 26.0-26.9, adult: Secondary | ICD-10-CM | POA: Diagnosis not present

## 2013-08-07 DIAGNOSIS — I10 Essential (primary) hypertension: Secondary | ICD-10-CM | POA: Diagnosis not present

## 2013-08-07 DIAGNOSIS — I2510 Atherosclerotic heart disease of native coronary artery without angina pectoris: Secondary | ICD-10-CM | POA: Diagnosis not present

## 2013-08-07 DIAGNOSIS — R0989 Other specified symptoms and signs involving the circulatory and respiratory systems: Secondary | ICD-10-CM | POA: Diagnosis not present

## 2013-09-03 DIAGNOSIS — D235 Other benign neoplasm of skin of trunk: Secondary | ICD-10-CM | POA: Diagnosis not present

## 2013-09-03 DIAGNOSIS — B079 Viral wart, unspecified: Secondary | ICD-10-CM | POA: Diagnosis not present

## 2013-09-03 DIAGNOSIS — L570 Actinic keratosis: Secondary | ICD-10-CM | POA: Diagnosis not present

## 2013-11-13 ENCOUNTER — Other Ambulatory Visit: Payer: Self-pay | Admitting: Internal Medicine

## 2013-11-13 DIAGNOSIS — Z6826 Body mass index (BMI) 26.0-26.9, adult: Secondary | ICD-10-CM | POA: Diagnosis not present

## 2013-11-13 DIAGNOSIS — E1151 Type 2 diabetes mellitus with diabetic peripheral angiopathy without gangrene: Secondary | ICD-10-CM | POA: Diagnosis not present

## 2013-11-13 DIAGNOSIS — R05 Cough: Secondary | ICD-10-CM | POA: Diagnosis not present

## 2013-11-13 DIAGNOSIS — I2510 Atherosclerotic heart disease of native coronary artery without angina pectoris: Secondary | ICD-10-CM | POA: Diagnosis not present

## 2013-11-13 DIAGNOSIS — I739 Peripheral vascular disease, unspecified: Secondary | ICD-10-CM | POA: Diagnosis not present

## 2013-11-13 DIAGNOSIS — Z23 Encounter for immunization: Secondary | ICD-10-CM | POA: Diagnosis not present

## 2013-11-13 DIAGNOSIS — Z139 Encounter for screening, unspecified: Secondary | ICD-10-CM

## 2013-11-13 DIAGNOSIS — I10 Essential (primary) hypertension: Secondary | ICD-10-CM | POA: Diagnosis not present

## 2013-11-17 DIAGNOSIS — E119 Type 2 diabetes mellitus without complications: Secondary | ICD-10-CM | POA: Diagnosis not present

## 2013-11-17 DIAGNOSIS — H25013 Cortical age-related cataract, bilateral: Secondary | ICD-10-CM | POA: Diagnosis not present

## 2013-11-19 ENCOUNTER — Other Ambulatory Visit: Payer: Self-pay | Admitting: Internal Medicine

## 2013-11-19 DIAGNOSIS — R05 Cough: Secondary | ICD-10-CM

## 2013-11-19 DIAGNOSIS — R059 Cough, unspecified: Secondary | ICD-10-CM

## 2013-11-20 ENCOUNTER — Ambulatory Visit
Admission: RE | Admit: 2013-11-20 | Discharge: 2013-11-20 | Disposition: A | Discharge status: Home / Self Care | Payer: Medicare Other | Source: Ambulatory Visit | Attending: Internal Medicine | Admitting: Internal Medicine

## 2013-11-20 DIAGNOSIS — R05 Cough: Secondary | ICD-10-CM | POA: Diagnosis not present

## 2013-11-20 DIAGNOSIS — Z951 Presence of aortocoronary bypass graft: Secondary | ICD-10-CM | POA: Diagnosis not present

## 2013-11-20 DIAGNOSIS — J3489 Other specified disorders of nose and nasal sinuses: Secondary | ICD-10-CM | POA: Diagnosis not present

## 2013-11-20 DIAGNOSIS — R059 Cough, unspecified: Secondary | ICD-10-CM

## 2013-12-01 DIAGNOSIS — H2513 Age-related nuclear cataract, bilateral: Secondary | ICD-10-CM | POA: Diagnosis not present

## 2013-12-16 DIAGNOSIS — H578 Other specified disorders of eye and adnexa: Secondary | ICD-10-CM | POA: Diagnosis not present

## 2013-12-16 DIAGNOSIS — H25812 Combined forms of age-related cataract, left eye: Secondary | ICD-10-CM | POA: Diagnosis not present

## 2013-12-16 DIAGNOSIS — H2512 Age-related nuclear cataract, left eye: Secondary | ICD-10-CM | POA: Diagnosis not present

## 2014-01-20 DIAGNOSIS — H2511 Age-related nuclear cataract, right eye: Secondary | ICD-10-CM | POA: Diagnosis not present

## 2014-01-20 DIAGNOSIS — H25811 Combined forms of age-related cataract, right eye: Secondary | ICD-10-CM | POA: Diagnosis not present

## 2014-02-26 DIAGNOSIS — I10 Essential (primary) hypertension: Secondary | ICD-10-CM | POA: Diagnosis not present

## 2014-02-26 DIAGNOSIS — I2510 Atherosclerotic heart disease of native coronary artery without angina pectoris: Secondary | ICD-10-CM | POA: Diagnosis not present

## 2014-02-26 DIAGNOSIS — E1151 Type 2 diabetes mellitus with diabetic peripheral angiopathy without gangrene: Secondary | ICD-10-CM | POA: Diagnosis not present

## 2014-02-26 DIAGNOSIS — E785 Hyperlipidemia, unspecified: Secondary | ICD-10-CM | POA: Diagnosis not present

## 2014-02-27 DIAGNOSIS — E119 Type 2 diabetes mellitus without complications: Secondary | ICD-10-CM | POA: Diagnosis not present

## 2014-07-06 DIAGNOSIS — E785 Hyperlipidemia, unspecified: Secondary | ICD-10-CM | POA: Diagnosis not present

## 2014-07-06 DIAGNOSIS — E1151 Type 2 diabetes mellitus with diabetic peripheral angiopathy without gangrene: Secondary | ICD-10-CM | POA: Diagnosis not present

## 2014-07-06 DIAGNOSIS — Z125 Encounter for screening for malignant neoplasm of prostate: Secondary | ICD-10-CM | POA: Diagnosis not present

## 2014-07-13 DIAGNOSIS — Z23 Encounter for immunization: Secondary | ICD-10-CM | POA: Diagnosis not present

## 2014-07-13 DIAGNOSIS — Z6826 Body mass index (BMI) 26.0-26.9, adult: Secondary | ICD-10-CM | POA: Diagnosis not present

## 2014-07-13 DIAGNOSIS — I739 Peripheral vascular disease, unspecified: Secondary | ICD-10-CM | POA: Diagnosis not present

## 2014-07-13 DIAGNOSIS — Z0000 Encounter for general adult medical examination without abnormal findings: Secondary | ICD-10-CM | POA: Diagnosis not present

## 2014-07-13 DIAGNOSIS — E785 Hyperlipidemia, unspecified: Secondary | ICD-10-CM | POA: Diagnosis not present

## 2014-07-13 DIAGNOSIS — I10 Essential (primary) hypertension: Secondary | ICD-10-CM | POA: Diagnosis not present

## 2014-07-13 DIAGNOSIS — Z1212 Encounter for screening for malignant neoplasm of rectum: Secondary | ICD-10-CM | POA: Diagnosis not present

## 2014-07-13 DIAGNOSIS — E1151 Type 2 diabetes mellitus with diabetic peripheral angiopathy without gangrene: Secondary | ICD-10-CM | POA: Diagnosis not present

## 2014-07-13 DIAGNOSIS — I2510 Atherosclerotic heart disease of native coronary artery without angina pectoris: Secondary | ICD-10-CM | POA: Diagnosis not present

## 2014-07-13 DIAGNOSIS — Z1389 Encounter for screening for other disorder: Secondary | ICD-10-CM | POA: Diagnosis not present

## 2014-08-07 ENCOUNTER — Emergency Department (HOSPITAL_COMMUNITY)
Admission: EM | Admit: 2014-08-07 | Discharge: 2014-08-07 | Disposition: A | Discharge status: Home / Self Care | Payer: Medicare Other | Attending: Emergency Medicine | Admitting: Emergency Medicine

## 2014-08-07 ENCOUNTER — Encounter (HOSPITAL_COMMUNITY): Payer: Self-pay | Admitting: Emergency Medicine

## 2014-08-07 DIAGNOSIS — E785 Hyperlipidemia, unspecified: Secondary | ICD-10-CM | POA: Diagnosis not present

## 2014-08-07 DIAGNOSIS — E161 Other hypoglycemia: Secondary | ICD-10-CM | POA: Diagnosis not present

## 2014-08-07 DIAGNOSIS — I2510 Atherosclerotic heart disease of native coronary artery without angina pectoris: Secondary | ICD-10-CM | POA: Diagnosis not present

## 2014-08-07 DIAGNOSIS — E11649 Type 2 diabetes mellitus with hypoglycemia without coma: Secondary | ICD-10-CM | POA: Insufficient documentation

## 2014-08-07 DIAGNOSIS — Z79899 Other long term (current) drug therapy: Secondary | ICD-10-CM | POA: Insufficient documentation

## 2014-08-07 DIAGNOSIS — R55 Syncope and collapse: Secondary | ICD-10-CM | POA: Insufficient documentation

## 2014-08-07 DIAGNOSIS — Z880 Allergy status to penicillin: Secondary | ICD-10-CM | POA: Insufficient documentation

## 2014-08-07 DIAGNOSIS — Z720 Tobacco use: Secondary | ICD-10-CM | POA: Diagnosis not present

## 2014-08-07 DIAGNOSIS — Z7982 Long term (current) use of aspirin: Secondary | ICD-10-CM | POA: Diagnosis not present

## 2014-08-07 DIAGNOSIS — E162 Hypoglycemia, unspecified: Secondary | ICD-10-CM

## 2014-08-07 DIAGNOSIS — Z794 Long term (current) use of insulin: Secondary | ICD-10-CM | POA: Diagnosis not present

## 2014-08-07 DIAGNOSIS — I10 Essential (primary) hypertension: Secondary | ICD-10-CM | POA: Insufficient documentation

## 2014-08-07 DIAGNOSIS — R7309 Other abnormal glucose: Secondary | ICD-10-CM | POA: Diagnosis not present

## 2014-08-07 HISTORY — DX: Type 2 diabetes mellitus without complications: E11.9

## 2014-08-07 LAB — I-STAT TROPONIN, ED
TROPONIN I, POC: 0.01 ng/mL (ref 0.00–0.08)
Troponin i, poc: 0.02 ng/mL (ref 0.00–0.08)

## 2014-08-07 LAB — BASIC METABOLIC PANEL
Anion gap: 9 (ref 5–15)
BUN: 21 mg/dL — ABNORMAL HIGH (ref 6–20)
CO2: 23 mmol/L (ref 22–32)
CREATININE: 1.09 mg/dL (ref 0.61–1.24)
Calcium: 9.1 mg/dL (ref 8.9–10.3)
Chloride: 107 mmol/L (ref 101–111)
GFR calc non Af Amer: >60 mL/min (ref >60)
Glucose, Bld: 93 mg/dL (ref 65–99)
Potassium: 4.1 mmol/L (ref 3.5–5.1)
Sodium: 139 mmol/L (ref 135–145)

## 2014-08-07 LAB — CBC WITH DIFFERENTIAL/PLATELET
Basophils Absolute: 0 10*3/uL (ref 0.0–0.1)
Basophils Relative: 0 % (ref 0–1)
EOS ABS: 0.5 10*3/uL (ref 0.0–0.7)
Eosinophils Relative: 4 % (ref 0–5)
HEMATOCRIT: 38.2 % — AB (ref 39.0–52.0)
HEMOGLOBIN: 12.6 g/dL — AB (ref 13.0–17.0)
LYMPHS ABS: 1.4 10*3/uL (ref 0.7–4.0)
Lymphocytes Relative: 12 % (ref 12–46)
MCH: 30.8 pg (ref 26.0–34.0)
MCHC: 33 g/dL (ref 30.0–36.0)
MCV: 93.4 fL (ref 78.0–100.0)
MONO ABS: 1.2 10*3/uL — AB (ref 0.1–1.0)
MONOS PCT: 10 % (ref 3–12)
Neutro Abs: 9 10*3/uL — ABNORMAL HIGH (ref 1.7–7.7)
Neutrophils Relative %: 74 % (ref 43–77)
Platelets: 235 10*3/uL (ref 150–400)
RBC: 4.09 MIL/uL — AB (ref 4.22–5.81)
RDW: 12.7 % (ref 11.5–15.5)
WBC: 12.1 10*3/uL — ABNORMAL HIGH (ref 4.0–10.5)

## 2014-08-07 LAB — CBG MONITORING, ED: GLUCOSE-CAPILLARY: 103 mg/dL — AB (ref 65–99)
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
## 2014-08-07 NOTE — Discharge Instructions (Signed)
Hypoglycemia Hypoglycemia occurs when the glucose in your blood is too low. Glucose is a type of sugar that is your body's main energy source. Hormones, such as insulin and glucagon, control the level of glucose in the blood. Insulin lowers blood glucose and glucagon increases blood glucose. Having too much insulin in your blood stream, or not eating enough food containing sugar, can result in hypoglycemia. Hypoglycemia can happen to people with or without diabetes. It can develop quickly and can be a medical emergency.  CAUSES   Missing or delaying meals.  Not eating enough carbohydrates at meals.  Taking too much diabetes medicine.  Not timing your oral diabetes medicine or insulin doses with meals, snacks, and exercise.  Nausea and vomiting.  Certain medicines.  Severe illnesses, such as hepatitis, kidney disorders, and certain eating disorders.  Increased activity or exercise without eating something extra or adjusting medicines.  Drinking too much alcohol.  A nerve disorder that affects body functions like your heart rate, blood pressure, and digestion (autonomic neuropathy).  A condition where the stomach muscles do not function properly (gastroparesis). Therefore, medicines and food may not absorb properly.  Rarely, a tumor of the pancreas can produce too much insulin. SYMPTOMS   Hunger.  Sweating (diaphoresis).  Change in body temperature.  Shakiness.  Headache.  Anxiety.  Lightheadedness.  Irritability.  Difficulty concentrating.  Dry mouth.  Tingling or numbness in the hands or feet.  Restless sleep or sleep disturbances.  Altered speech and coordination.  Change in mental status.  Seizures or prolonged convulsions.  Combativeness.  Drowsiness (lethargic).  Weakness.  Increased heart rate or palpitations.  Confusion.  Pale, gray skin color.  Blurred or double vision.  Fainting. DIAGNOSIS  A physical exam and medical history will be  performed. Your caregiver may make a diagnosis based on your symptoms. Blood tests and other lab tests may be performed to confirm a diagnosis. Once the diagnosis is made, your caregiver will see if your signs and symptoms go away once your blood glucose is raised.  TREATMENT  Usually, you can easily treat your hypoglycemia when you notice symptoms.  Check your blood glucose. If it is less than 70 mg/dl, take one of the following:   3-4 glucose tablets.    cup juice.    cup regular soda.   1 cup skim milk.   -1 tube of glucose gel.   5-6 hard candies.   Avoid high-fat drinks or food that may delay a rise in blood glucose levels.  Do not take more than the recommended amount of sugary foods, drinks, gel, or tablets. Doing so will cause your blood glucose to go too high.   Wait 10-15 minutes and recheck your blood glucose. If it is still less than 70 mg/dl or below your target range, repeat treatment.   Eat a snack if it is more than 1 hour until your next meal.  There may be a time when your blood glucose may go so low that you are unable to treat yourself at home when you start to notice symptoms. You may need someone to help you. You may even faint or be unable to swallow. If you cannot treat yourself, someone will need to bring you to the hospital.  [place]  If you have diabetes, follow your diabetes management plan by:  Taking your medicines as directed.  Following your exercise plan.  Following your meal plan. Do not skip meals. Eat on time.  Testing your blood  glucose regularly. Check your blood glucose before and after exercise. If you exercise longer or different than usual, be sure to check blood glucose more frequently.  Wearing your medical alert jewelry that says you have diabetes.  Identify the cause of your hypoglycemia. Then, develop ways to prevent the recurrence of hypoglycemia.  Do not take a hot bath or shower right after an  insulin shot.  Always carry treatment with you. Glucose tablets are the easiest to carry.  If you are going to drink alcohol, drink it only with meals.  Tell friends or family members ways to keep you safe during a seizure. This may include removing hard or sharp objects from the area or turning you on your side.  Maintain a healthy weight. SEEK MEDICAL CARE IF:   You are having problems keeping your blood glucose in your target range.  You are having frequent episodes of hypoglycemia.  You feel you might be having side effects from your medicines.  You are not sure why your blood glucose is dropping so low.  You notice a change in vision or a new problem with your vision. SEEK IMMEDIATE MEDICAL CARE IF:   Confusion develops.  A change in mental status occurs.  The inability to swallow develops.  Fainting occurs. Document Released: 01/16/2005 Document Revised: 01/21/2013 Document Reviewed: 05/15/2011 [facility] Patient Information 2015 [place], [place]. This information is not intended to replace advice given to you by your health care provider. Make sure you discuss any questions you have with your health care provider. Syncope Syncope is a medical term for fainting or passing out. This means you lose consciousness and drop to the ground. People are generally unconscious for less than 5 minutes. You may have some muscle twitches for up to 15 seconds before waking up and returning to normal. Syncope occurs more often in older adults, but it can happen to anyone. While most causes of syncope are not dangerous, syncope can be a sign of a serious medical problem. It is important to seek medical care.  CAUSES  Syncope is caused by a sudden drop in blood flow to the brain. The specific cause is often not determined. Factors that can bring on syncope include:  Taking medicines that lower blood pressure.  Sudden changes in posture, such as standing up quickly.  Taking more medicine than  prescribed.  Standing in one place for too long.  Seizure disorders.  Dehydration and excessive exposure to heat.  Low blood sugar (hypoglycemia).  Straining to have a bowel movement.  Heart disease, irregular heartbeat, or other circulatory problems.  Fear, emotional distress, seeing blood, or severe pain. SYMPTOMS  Right before fainting, you may:  Feel dizzy or light-headed.  Feel nauseous.  See all white or all black in your field of vision.  Have cold, clammy skin. DIAGNOSIS  Your health care provider will ask about your symptoms, perform a physical exam, and perform an electrocardiogram (ECG) to record the electrical activity of your heart. Your health care provider may also perform other heart or blood tests to determine the cause of your syncope which may include:  Transthoracic echocardiogram (TTE). During echocardiography, sound waves are used to evaluate how blood flows through your heart.  Transesophageal echocardiogram (TEE).  Cardiac monitoring. This allows your health care provider to monitor your heart rate and rhythm in real time.  Holter monitor. This is a portable device that records your heartbeat and can help diagnose heart arrhythmias. It allows your health care provider to  track your heart activity for several days, if needed.  Stress tests by exercise or by giving medicine that makes the heart beat faster. TREATMENT  In most cases, no treatment is needed. Depending on the cause of your syncope, your health care provider may recommend changing or stopping some of your medicines. HOME CARE INSTRUCTIONS  Have someone stay with you until you feel stable.  Do not drive, use machinery, or play sports until your health care provider says it is okay.  Keep all follow-up appointments as directed by your health care provider.  Lie down right away if you start feeling like you might faint. Breathe deeply and steadily. Wait until all the symptoms have  passed.  Drink enough fluids to keep your urine clear or pale yellow.  If you are taking blood pressure or heart medicine, get up slowly and take several minutes to sit and then stand. This can reduce dizziness. SEEK IMMEDIATE MEDICAL CARE IF:   You have a severe headache.  You have unusual pain in the chest, abdomen, or back.  You are bleeding from your mouth or rectum, or you have black or tarry stool.  You have an irregular or very fast heartbeat.  You have pain with breathing.  You have repeated fainting or seizure-like jerking during an episode.  You faint when sitting or lying down.  You have confusion.  You have trouble walking.  You have severe weakness.  You have vision problems. If you fainted, call your local emergency services (911 in [place] Do not drive yourself to the hospital.  MAKE SURE YOU:  Understand these instructions.  Will watch your condition.  Will get help right away if you are not doing well or get worse. Document Released: 01/16/2005 Document Revised: 01/21/2013 Document Reviewed: 03/17/2011 [facility] Patient Information 2015 [place], [place]. This information is not intended to replace advice given to you by your health care provider. Make sure you discuss any questions you have with your health care provider.

## 2014-08-07 NOTE — ED Notes (Signed)
Gave patient saltines, grahams, peanut butter, and apple juice

## 2014-10-05 DIAGNOSIS — E785 Hyperlipidemia, unspecified: Secondary | ICD-10-CM | POA: Insufficient documentation

## 2014-10-05 DIAGNOSIS — I25810 Atherosclerosis of coronary artery bypass graft(s) without angina pectoris: Secondary | ICD-10-CM | POA: Insufficient documentation

## 2014-10-05 DIAGNOSIS — E118 Type 2 diabetes mellitus with unspecified complications: Secondary | ICD-10-CM | POA: Insufficient documentation

## 2014-10-05 DIAGNOSIS — I10 Essential (primary) hypertension: Secondary | ICD-10-CM | POA: Insufficient documentation
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
## 2014-10-06 ENCOUNTER — Ambulatory Visit (INDEPENDENT_AMBULATORY_CARE_PROVIDER_SITE_OTHER): Payer: Medicare Other | Admitting: Interventional Cardiology

## 2014-10-06 ENCOUNTER — Encounter: Payer: Self-pay | Admitting: Interventional Cardiology

## 2014-10-06 VITALS — BP 136/58 | HR 71 | Ht 72.0 in | Wt 198.8 lb

## 2014-10-06 DIAGNOSIS — E785 Hyperlipidemia, unspecified: Secondary | ICD-10-CM | POA: Diagnosis not present

## 2014-10-06 DIAGNOSIS — I25810 Atherosclerosis of coronary artery bypass graft(s) without angina pectoris: Secondary | ICD-10-CM

## 2014-10-06 DIAGNOSIS — I6523 Occlusion and stenosis of bilateral carotid arteries: Secondary | ICD-10-CM

## 2014-10-06 DIAGNOSIS — M25512 Pain in left shoulder: Secondary | ICD-10-CM

## 2014-10-06 DIAGNOSIS — I779 Disorder of arteries and arterioles, unspecified: Secondary | ICD-10-CM | POA: Insufficient documentation

## 2014-10-06 DIAGNOSIS — E118 Type 2 diabetes mellitus with unspecified complications: Secondary | ICD-10-CM

## 2014-10-06 DIAGNOSIS — I10 Essential (primary) hypertension: Secondary | ICD-10-CM

## 2014-10-06 DIAGNOSIS — I739 Peripheral vascular disease, unspecified: Secondary | ICD-10-CM

## 2014-10-06 NOTE — Patient Instructions (Signed)
Medication Instructions:  Your physician recommends that you continue on your current medications as directed. Please refer to the Current Medication list given to you today.   Labwork: None ordered  Testing/Procedures: Your physician has requested that you have a carotid duplex. This test is an ultrasound of the carotid arteries in your neck. It looks at blood flow through these arteries that supply the brain with blood. Allow one hour for this exam. There are no restrictions or special instructions.   Follow-Up: Your physician wants you to follow-up in: 1 year with Dr.Smith You will receive a reminder letter in the mail two months in advance. If you don't receive a letter, please call our office to schedule the follow-up appointment.   Any Other Special Instructions Will Be Listed Below (If Applicable).

## 2014-10-12 ENCOUNTER — Ambulatory Visit (HOSPITAL_COMMUNITY)
Admission: RE | Admit: 2014-10-12 | Discharge: 2014-10-12 | Disposition: A | Discharge status: Home / Self Care | Payer: Medicare Other | Source: Ambulatory Visit | Attending: Interventional Cardiology | Admitting: Interventional Cardiology

## 2014-10-12 DIAGNOSIS — I10 Essential (primary) hypertension: Secondary | ICD-10-CM | POA: Diagnosis not present

## 2014-10-12 DIAGNOSIS — E119 Type 2 diabetes mellitus without complications: Secondary | ICD-10-CM | POA: Insufficient documentation

## 2014-10-12 DIAGNOSIS — I2510 Atherosclerotic heart disease of native coronary artery without angina pectoris: Secondary | ICD-10-CM | POA: Diagnosis not present

## 2014-10-12 DIAGNOSIS — I6523 Occlusion and stenosis of bilateral carotid arteries: Secondary | ICD-10-CM | POA: Insufficient documentation

## 2014-10-12 DIAGNOSIS — E785 Hyperlipidemia, unspecified: Secondary | ICD-10-CM | POA: Insufficient documentation

## 2014-10-14 ENCOUNTER — Telehealth: Payer: Self-pay

## 2014-10-14 DIAGNOSIS — I6523 Occlusion and stenosis of bilateral carotid arteries: Secondary | ICD-10-CM

## 2014-10-14 NOTE — Telephone Encounter (Signed)
Pt aware of carotid results with verbal understanding. The patient know that both carotids have between 40-60% obstruction.  No specific therapy is needed but he will need repeat study in one year.

## 2014-10-14 NOTE — Telephone Encounter (Signed)
-----   Message from Belva Crome, MD sent at 10/13/2014  1:28 PM EDT ----- Regarding: Carotid Doppler results The patient know that both carotids have between 40-60% obstruction. No specific therapy is needed but he will need repeat study in one year.

## 2014-10-15 DIAGNOSIS — Z6826 Body mass index (BMI) 26.0-26.9, adult: Secondary | ICD-10-CM | POA: Diagnosis not present

## 2014-10-15 DIAGNOSIS — I10 Essential (primary) hypertension: Secondary | ICD-10-CM | POA: Diagnosis not present

## 2014-10-15 DIAGNOSIS — I2510 Atherosclerotic heart disease of native coronary artery without angina pectoris: Secondary | ICD-10-CM | POA: Diagnosis not present

## 2014-10-15 DIAGNOSIS — R0989 Other specified symptoms and signs involving the circulatory and respiratory systems: Secondary | ICD-10-CM | POA: Diagnosis not present

## 2014-10-15 DIAGNOSIS — E1151 Type 2 diabetes mellitus with diabetic peripheral angiopathy without gangrene: Secondary | ICD-10-CM | POA: Diagnosis not present

## 2014-10-15 DIAGNOSIS — I739 Peripheral vascular disease, unspecified: Secondary | ICD-10-CM | POA: Diagnosis not present

## 2014-11-17 DIAGNOSIS — C44310 Basal cell carcinoma of skin of unspecified parts of face: Secondary | ICD-10-CM | POA: Diagnosis not present

## 2014-11-17 DIAGNOSIS — Z6827 Body mass index (BMI) 27.0-27.9, adult: Secondary | ICD-10-CM | POA: Diagnosis not present

## 2014-11-17 DIAGNOSIS — E1151 Type 2 diabetes mellitus with diabetic peripheral angiopathy without gangrene: Secondary | ICD-10-CM | POA: Diagnosis not present

## 2014-11-17 DIAGNOSIS — H938X1 Other specified disorders of right ear: Secondary | ICD-10-CM | POA: Diagnosis not present

## 2014-11-17 DIAGNOSIS — Z23 Encounter for immunization: Secondary | ICD-10-CM | POA: Diagnosis not present

## 2014-12-14 DIAGNOSIS — D1801 Hemangioma of skin and subcutaneous tissue: Secondary | ICD-10-CM | POA: Diagnosis not present

## 2014-12-14 DIAGNOSIS — L570 Actinic keratosis: Secondary | ICD-10-CM | POA: Diagnosis not present

## 2014-12-14 DIAGNOSIS — L821 Other seborrheic keratosis: Secondary | ICD-10-CM | POA: Diagnosis not present

## 2014-12-14 DIAGNOSIS — D225 Melanocytic nevi of trunk: Secondary | ICD-10-CM | POA: Diagnosis not present

## 2015-01-15 DIAGNOSIS — E1151 Type 2 diabetes mellitus with diabetic peripheral angiopathy without gangrene: Secondary | ICD-10-CM | POA: Diagnosis not present

## 2015-01-15 DIAGNOSIS — I10 Essential (primary) hypertension: Secondary | ICD-10-CM | POA: Diagnosis not present

## 2015-01-15 DIAGNOSIS — R0989 Other specified symptoms and signs involving the circulatory and respiratory systems: Secondary | ICD-10-CM | POA: Diagnosis not present

## 2015-01-15 DIAGNOSIS — I2510 Atherosclerotic heart disease of native coronary artery without angina pectoris: Secondary | ICD-10-CM | POA: Diagnosis not present

## 2015-01-15 DIAGNOSIS — I739 Peripheral vascular disease, unspecified: Secondary | ICD-10-CM | POA: Diagnosis not present

## 2015-01-15 DIAGNOSIS — Z6827 Body mass index (BMI) 27.0-27.9, adult: Secondary | ICD-10-CM | POA: Diagnosis not present

## 2015-01-15 DIAGNOSIS — Z720 Tobacco use: Secondary | ICD-10-CM | POA: Diagnosis not present

## 2015-03-05 DIAGNOSIS — R05 Cough: Secondary | ICD-10-CM | POA: Diagnosis not present

## 2015-03-05 DIAGNOSIS — I10 Essential (primary) hypertension: Secondary | ICD-10-CM | POA: Diagnosis not present

## 2015-03-05 DIAGNOSIS — Z6826 Body mass index (BMI) 26.0-26.9, adult: Secondary | ICD-10-CM | POA: Diagnosis not present

## 2015-05-13 DIAGNOSIS — I7389 Other specified peripheral vascular diseases: Secondary | ICD-10-CM | POA: Diagnosis not present

## 2015-05-13 DIAGNOSIS — I2510 Atherosclerotic heart disease of native coronary artery without angina pectoris: Secondary | ICD-10-CM | POA: Diagnosis not present

## 2015-05-13 DIAGNOSIS — I10 Essential (primary) hypertension: Secondary | ICD-10-CM | POA: Diagnosis not present

## 2015-05-13 DIAGNOSIS — Z6826 Body mass index (BMI) 26.0-26.9, adult: Secondary | ICD-10-CM | POA: Diagnosis not present

## 2015-05-13 DIAGNOSIS — E1151 Type 2 diabetes mellitus with diabetic peripheral angiopathy without gangrene: Secondary | ICD-10-CM | POA: Diagnosis not present

## 2015-06-16 DIAGNOSIS — E1165 Type 2 diabetes mellitus with hyperglycemia: Secondary | ICD-10-CM | POA: Diagnosis not present

## 2015-06-16 DIAGNOSIS — I10 Essential (primary) hypertension: Secondary | ICD-10-CM | POA: Diagnosis not present

## 2015-06-16 DIAGNOSIS — Z6828 Body mass index (BMI) 28.0-28.9, adult: Secondary | ICD-10-CM | POA: Diagnosis not present

## 2015-06-16 DIAGNOSIS — E1151 Type 2 diabetes mellitus with diabetic peripheral angiopathy without gangrene: Secondary | ICD-10-CM | POA: Diagnosis not present

## 2015-07-01 DIAGNOSIS — E1151 Type 2 diabetes mellitus with diabetic peripheral angiopathy without gangrene: Secondary | ICD-10-CM | POA: Diagnosis not present

## 2015-07-21 DIAGNOSIS — Z6827 Body mass index (BMI) 27.0-27.9, adult: Secondary | ICD-10-CM | POA: Diagnosis not present

## 2015-07-21 DIAGNOSIS — I10 Essential (primary) hypertension: Secondary | ICD-10-CM | POA: Diagnosis not present

## 2015-07-21 DIAGNOSIS — E1151 Type 2 diabetes mellitus with diabetic peripheral angiopathy without gangrene: Secondary | ICD-10-CM | POA: Diagnosis not present

## 2015-08-24 ENCOUNTER — Encounter: Payer: Self-pay | Admitting: Internal Medicine

## 2015-09-16 DIAGNOSIS — E1151 Type 2 diabetes mellitus with diabetic peripheral angiopathy without gangrene: Secondary | ICD-10-CM | POA: Diagnosis not present

## 2015-09-16 DIAGNOSIS — I10 Essential (primary) hypertension: Secondary | ICD-10-CM | POA: Diagnosis not present

## 2015-09-16 DIAGNOSIS — E784 Other hyperlipidemia: Secondary | ICD-10-CM | POA: Diagnosis not present

## 2015-09-16 DIAGNOSIS — Z125 Encounter for screening for malignant neoplasm of prostate: Secondary | ICD-10-CM | POA: Diagnosis not present

## 2015-09-23 DIAGNOSIS — I2510 Atherosclerotic heart disease of native coronary artery without angina pectoris: Secondary | ICD-10-CM | POA: Diagnosis not present

## 2015-09-23 DIAGNOSIS — Z0000 Encounter for general adult medical examination without abnormal findings: Secondary | ICD-10-CM | POA: Diagnosis not present

## 2015-09-23 DIAGNOSIS — E1151 Type 2 diabetes mellitus with diabetic peripheral angiopathy without gangrene: Secondary | ICD-10-CM | POA: Diagnosis not present

## 2015-09-23 DIAGNOSIS — Z6826 Body mass index (BMI) 26.0-26.9, adult: Secondary | ICD-10-CM | POA: Diagnosis not present

## 2015-09-23 DIAGNOSIS — I7389 Other specified peripheral vascular diseases: Secondary | ICD-10-CM | POA: Diagnosis not present

## 2015-09-23 DIAGNOSIS — Z1389 Encounter for screening for other disorder: Secondary | ICD-10-CM | POA: Diagnosis not present

## 2015-09-23 DIAGNOSIS — E784 Other hyperlipidemia: Secondary | ICD-10-CM | POA: Diagnosis not present

## 2015-09-23 DIAGNOSIS — Z720 Tobacco use: Secondary | ICD-10-CM | POA: Diagnosis not present

## 2015-09-23 DIAGNOSIS — I10 Essential (primary) hypertension: Secondary | ICD-10-CM | POA: Diagnosis not present

## 2015-09-23 DIAGNOSIS — Z23 Encounter for immunization: Secondary | ICD-10-CM | POA: Diagnosis not present

## 2015-10-26 ENCOUNTER — Encounter: Payer: Self-pay | Admitting: Internal Medicine

## 2015-10-29 ENCOUNTER — Ambulatory Visit (HOSPITAL_COMMUNITY)
Admission: RE | Admit: 2015-10-29 | Discharge: 2015-10-29 | Disposition: A | Discharge status: Home / Self Care | Payer: Medicare Other | Source: Ambulatory Visit | Attending: Cardiology | Admitting: Cardiology

## 2015-10-29 DIAGNOSIS — I2510 Atherosclerotic heart disease of native coronary artery without angina pectoris: Secondary | ICD-10-CM | POA: Diagnosis not present

## 2015-10-29 DIAGNOSIS — Z720 Tobacco use: Secondary | ICD-10-CM | POA: Insufficient documentation

## 2015-10-29 DIAGNOSIS — I6523 Occlusion and stenosis of bilateral carotid arteries: Secondary | ICD-10-CM

## 2015-10-29 DIAGNOSIS — E119 Type 2 diabetes mellitus without complications: Secondary | ICD-10-CM | POA: Insufficient documentation

## 2015-10-29 DIAGNOSIS — E785 Hyperlipidemia, unspecified: Secondary | ICD-10-CM | POA: Diagnosis not present

## 2015-10-29 DIAGNOSIS — I10 Essential (primary) hypertension: Secondary | ICD-10-CM | POA: Insufficient documentation

## 2015-10-29 DIAGNOSIS — Z951 Presence of aortocoronary bypass graft: Secondary | ICD-10-CM | POA: Insufficient documentation

## 2015-11-08 ENCOUNTER — Telehealth: Payer: Self-pay | Admitting: *Deleted

## 2015-11-08 DIAGNOSIS — I6523 Occlusion and stenosis of bilateral carotid arteries: Secondary | ICD-10-CM

## 2015-11-08 NOTE — Telephone Encounter (Signed)
Informed pt of carotid results. Pt verbalized understanding and was in agreement with this plan. Order placed for follow up carotid for next year.

## 2015-11-08 NOTE — Telephone Encounter (Signed)
-----   Message from Belva Crome, MD sent at 10/30/2015 12:47 PM EDT ----- Let the patient know doppler study is stable. Bilateral carotid obstruction < 60% Recheck in 1 year. A copy will be sent to Donnajean Lopes, MD

## 2015-11-09 DIAGNOSIS — Z6826 Body mass index (BMI) 26.0-26.9, adult: Secondary | ICD-10-CM | POA: Diagnosis not present

## 2015-11-09 DIAGNOSIS — E1151 Type 2 diabetes mellitus with diabetic peripheral angiopathy without gangrene: Secondary | ICD-10-CM | POA: Diagnosis not present

## 2015-11-09 DIAGNOSIS — I10 Essential (primary) hypertension: Secondary | ICD-10-CM | POA: Diagnosis not present

## 2015-11-30 DIAGNOSIS — E119 Type 2 diabetes mellitus without complications: Secondary | ICD-10-CM | POA: Diagnosis not present

## 2015-12-02 DIAGNOSIS — E119 Type 2 diabetes mellitus without complications: Secondary | ICD-10-CM | POA: Diagnosis not present

## 2015-12-02 DIAGNOSIS — S51811A Laceration without foreign body of right forearm, initial encounter: Secondary | ICD-10-CM | POA: Diagnosis not present

## 2015-12-02 DIAGNOSIS — Z794 Long term (current) use of insulin: Secondary | ICD-10-CM | POA: Diagnosis not present

## 2015-12-02 DIAGNOSIS — Z951 Presence of aortocoronary bypass graft: Secondary | ICD-10-CM | POA: Diagnosis not present

## 2015-12-02 DIAGNOSIS — W19XXXA Unspecified fall, initial encounter: Secondary | ICD-10-CM | POA: Diagnosis not present

## 2015-12-02 DIAGNOSIS — I10 Essential (primary) hypertension: Secondary | ICD-10-CM | POA: Diagnosis not present

## 2015-12-02 DIAGNOSIS — I252 Old myocardial infarction: Secondary | ICD-10-CM | POA: Diagnosis not present

## 2015-12-13 ENCOUNTER — Ambulatory Visit (AMBULATORY_SURGERY_CENTER): Payer: Self-pay | Admitting: *Deleted

## 2015-12-13 VITALS — Ht 72.0 in | Wt 196.0 lb

## 2015-12-13 DIAGNOSIS — Z1211 Encounter for screening for malignant neoplasm of colon: Secondary | ICD-10-CM

## 2015-12-13 NOTE — Progress Notes (Signed)
No egg or soy allergy known to patient  No issues with past sedation with any surgeries  or procedures, no intubation problems  No diet pills per patient No home 02 use per patient  No blood thinners per patient  Pt denies issues with constipation  No A fib or A flutter   

## 2015-12-14 DIAGNOSIS — S40811A Abrasion of right upper arm, initial encounter: Secondary | ICD-10-CM | POA: Diagnosis not present

## 2015-12-14 DIAGNOSIS — Z6826 Body mass index (BMI) 26.0-26.9, adult: Secondary | ICD-10-CM | POA: Diagnosis not present

## 2015-12-20 ENCOUNTER — Encounter: Payer: Medicare Other | Admitting: Internal Medicine

## 2015-12-27 ENCOUNTER — Ambulatory Visit (AMBULATORY_SURGERY_CENTER): Payer: Medicare Other | Admitting: Internal Medicine

## 2015-12-27 ENCOUNTER — Encounter: Payer: Self-pay | Admitting: Internal Medicine

## 2015-12-27 VITALS — BP 137/56 | HR 59 | Temp 98.7°F | Resp 16 | Ht 72.0 in | Wt 196.0 lb

## 2015-12-27 DIAGNOSIS — Z1212 Encounter for screening for malignant neoplasm of rectum: Secondary | ICD-10-CM | POA: Diagnosis not present

## 2015-12-27 DIAGNOSIS — I10 Essential (primary) hypertension: Secondary | ICD-10-CM | POA: Diagnosis not present

## 2015-12-27 DIAGNOSIS — E119 Type 2 diabetes mellitus without complications: Secondary | ICD-10-CM | POA: Diagnosis not present

## 2015-12-27 DIAGNOSIS — Z1211 Encounter for screening for malignant neoplasm of colon: Secondary | ICD-10-CM

## 2015-12-27 DIAGNOSIS — I2510 Atherosclerotic heart disease of native coronary artery without angina pectoris: Secondary | ICD-10-CM | POA: Diagnosis not present

## 2015-12-27 DIAGNOSIS — K5520 Angiodysplasia of colon without hemorrhage: Secondary | ICD-10-CM

## 2015-12-27 HISTORY — DX: Angiodysplasia of colon without hemorrhage: K55.20

## 2015-12-27 LAB — GLUCOSE, CAPILLARY
Glucose-Capillary: 181 mg/dL — ABNORMAL HIGH (ref 65–99)
Glucose-Capillary: 175 mg/dL — ABNORMAL HIGH (ref 65–99)

## 2015-12-27 MED ORDER — SODIUM CHLORIDE 0.9 % IV SOLN: 500.0000 mL | INTRAVENOUS | Status: AC

## 2015-12-27 NOTE — Progress Notes (Signed)
  [facility] Post-op Note  Patient: Leonard Garcia.  Procedure(s) Performed: colonoscopy  Patient Location: [facility] - Recovery Area  Anesthesia Type: Deep Sedation/Propofol  Level of Consciousness: awake, oriented and patient cooperative  Airway and Oxygen Therapy: Patient Spontanous Breathing  Post-op Pain: none  Post-op Assessment:  Post-op Vital signs reviewed, Patient's Cardiovascular Status Stable, Respiratory Function Stable, Patent Airway, No signs of Nausea or vomiting and Pain level controlled  Post-op Vital Signs: Reviewed and stable  Complications: No apparent anesthesia complications  Antuan Limes E 1:46 PM

## 2015-12-27 NOTE — Patient Instructions (Addendum)
No polyps or cancer seen. You do have diverticulosis - thickened muscle rings and pouches in the colon wall. Please read the handout about this condition.  Also have angiodysplasia or AVM's - see photos - blood vessels on surface of colon lining - sometimes these leak blood or bleed. Usually not a problem.  At your age I do not recommend you do any more colonoscopy nor should you do the yearly stool tests for blood. Tell them you had a colonoscopy and don't need any more colon cancer screening tests.  I appreciate the opportunity to care for you. Gatha Mayer, MD, [facility]   Discharge instructions given. Handout on diverticulosis. Resume previous medications. YOU HAD AN ENDOSCOPIC PROCEDURE TODAY AT [facility]:   Refer to the procedure report that was given to you for any specific questions about what was found during the examination.  If the procedure report does not answer your questions, please call your gastroenterologist to clarify.  If you requested that your care partner not be given the details of your procedure findings, then the procedure report has been included in a sealed envelope for you to review at your convenience later.  YOU SHOULD EXPECT: Some feelings of bloating in the abdomen. Passage of more gas than usual.  Walking can help get rid of the air that was put into your GI tract during the procedure and reduce the bloating. If you had a lower endoscopy (such as a colonoscopy or flexible sigmoidoscopy) you may notice spotting of blood in your stool or on the toilet paper. If you underwent a bowel prep for your procedure, you may not have a normal bowel movement for a few days.  Please Note:  You might notice some irritation and congestion in your nose or some drainage.  This is from the oxygen used during your procedure.  There is no need for concern and it should clear up in a day or so.  SYMPTOMS TO REPORT IMMEDIATELY:   Following lower endoscopy  (colonoscopy or flexible sigmoidoscopy):  Excessive amounts of blood in the stool  Significant tenderness or worsening of abdominal pains  Swelling of the abdomen that is new, acute  Fever of 100F or higher  For urgent or emergent issues, a gastroenterologist can be reached at any hour by calling [phone].   DIET:  We do recommend a small meal at first, but then you may proceed to your regular diet.  Drink plenty of fluids but you should avoid alcoholic beverages for 24 hours.  ACTIVITY:  You should plan to take it easy for the rest of today and you should NOT DRIVE or use heavy machinery until tomorrow (because of the sedation medicines used during the test).    FOLLOW UP: Our staff will call the number listed on your records the next business day following your procedure to check on you and address any questions or concerns that you may have regarding the information given to you following your procedure. If we do not reach you, we will leave a message.  However, if you are feeling well and you are not experiencing any problems, there is no need to return our call.  We will assume that you have returned to your regular daily activities without incident.  If any biopsies were taken you will be contacted by phone or by letter within the next 1-3 weeks.  Please call us at [phone] if you have not heard about the biopsies in 3  weeks.    SIGNATURES/CONFIDENTIALITY: You and/or your care partner have signed paperwork which will be entered into your electronic medical record.  These signatures attest to the fact that that the information above on your After Visit Summary has been reviewed and is understood.  Full responsibility of the confidentiality of this discharge information lies with you and/or your care-partner.

## 2015-12-27 NOTE — Op Note (Signed)
[place] Patient Name: Leonard Garcia Procedure Date: 12/27/2015 1:09 PM MRN: [id number] Endoscopist: Gatha Mayer , MD Age: 75 Referring MD:  Date of Birth: Mar 30, 1940 Gender: Male Account #: [id number] Procedure:                Colonoscopy Indications:              Screening for colorectal malignant neoplasm, Last                            colonoscopy: 2007 Medicines:                Propofol per Anesthesia, Monitored Anesthesia Care Procedure:                Pre-Anesthesia Assessment:                           - Prior to the procedure, a History and Physical                            was performed, and patient medications and                            allergies were reviewed. The patient's tolerance of                            previous anesthesia was also reviewed. The risks                            and benefits of the procedure and the sedation                            options and risks were discussed with the patient.                            All questions were answered, and informed consent                            was obtained. Prior Anticoagulants: The patient                            last took aspirin 2 days prior to the procedure.                            ASA Grade Assessment: II - A patient with mild                            systemic disease. After reviewing the risks and                            benefits, the patient was deemed in satisfactory                            condition to undergo the procedure.  After obtaining informed consent, the colonoscope                            was passed under direct vision. Throughout the                            procedure, the patient's blood pressure, pulse, and                            oxygen saturations were monitored continuously. The                            Model CF-HQ190L [id number] scope was introduced                            through the anus and  advanced to the the cecum,                            identified by appendiceal orifice and ileocecal                            valve. The colonoscopy was performed without                            difficulty. The patient tolerated the procedure                            well. The quality of the bowel preparation was                            good. The bowel preparation used was Miralax. The                            ileocecal valve, appendiceal orifice, and rectum                            were photographed. Scope In: 1:24:23 PM Scope Out: 1:39:55 PM Scope Withdrawal Time: 0 hours 11 minutes 0 seconds  Total Procedure Duration: 0 hours 15 minutes 32 seconds  Findings:                 The perianal and digital rectal examinations were                            normal. Pertinent negatives include normal prostate                            (size, shape, and consistency).                           Three small localized angiodysplastic lesions                            without bleeding were found in the cecum.  Many small and large-mouthed diverticula were found                            in the sigmoid colon. There was narrowing of the                            colon in association with the diverticular opening.                           The exam was otherwise without abnormality on                            direct and retroflexion views. Complications:            No immediate complications. Estimated Blood Loss:     Estimated blood loss: none. Estimated blood loss:                            none. Impression:               - Three non-bleeding colonic angiodysplastic                            lesions.                           - Severe diverticulosis in the sigmoid colon. There                            was narrowing of the colon in association with the                            diverticular opening.                           - The examination was  otherwise normal on direct                            and retroflexion views.                           - No specimens collected. Recommendation:           - Patient has a contact number available for                            emergencies. The signs and symptoms of potential                            delayed complications were discussed with the                            patient. Return to normal activities tomorrow.                            Written discharge instructions were provided to the  patient.                           - Resume previous diet.                           - Continue present medications.                           - No repeat colonoscopy due to age.                           - NO NEED FOR REPEAT COLONOSCOPY OR STOOL                            BASED-TESTS EITHER AT HIS AGE AND WITH THESE                            FINDINGS - ALSO AT HIGHER RISK OF FALSE + OCCULT                            BLOOD TESTING DUE TO ANGIODYSPLASIA OF CECUM Gatha Mayer, MD 12/27/2015 1:51:17 PM This report has been signed electronically.

## 2015-12-28 ENCOUNTER — Telehealth: Payer: Self-pay | Admitting: *Deleted

## 2015-12-28 NOTE — Telephone Encounter (Signed)
  Follow up Call-  Call back number 12/27/2015  Post procedure Call Back phone  # [phone] hm  Permission to leave phone message Yes  Some recent data might be hidden     Patient questions:  Do you have a fever, pain , or abdominal swelling? No. Pain Score  0 *  Have you tolerated food without any problems? Yes.    Have you been able to return to your normal activities? Yes.    Do you have any questions about your discharge instructions: Diet   No. Medications  No. Follow up visit  No.  Do you have questions or concerns about your Care? No.  Actions: * If pain score is 4 or above: No action needed, pain <4.

## 2015-12-31 DIAGNOSIS — I7389 Other specified peripheral vascular diseases: Secondary | ICD-10-CM | POA: Diagnosis not present

## 2015-12-31 DIAGNOSIS — Z6825 Body mass index (BMI) 25.0-25.9, adult: Secondary | ICD-10-CM | POA: Diagnosis not present

## 2015-12-31 DIAGNOSIS — E1151 Type 2 diabetes mellitus with diabetic peripheral angiopathy without gangrene: Secondary | ICD-10-CM | POA: Diagnosis not present

## 2015-12-31 DIAGNOSIS — I2510 Atherosclerotic heart disease of native coronary artery without angina pectoris: Secondary | ICD-10-CM | POA: Diagnosis not present

## 2015-12-31 DIAGNOSIS — I10 Essential (primary) hypertension: Secondary | ICD-10-CM | POA: Diagnosis not present

## 2016-02-08 DIAGNOSIS — Z6826 Body mass index (BMI) 26.0-26.9, adult: Secondary | ICD-10-CM | POA: Diagnosis not present

## 2016-02-08 DIAGNOSIS — I10 Essential (primary) hypertension: Secondary | ICD-10-CM | POA: Diagnosis not present

## 2016-02-08 DIAGNOSIS — E1151 Type 2 diabetes mellitus with diabetic peripheral angiopathy without gangrene: Secondary | ICD-10-CM | POA: Diagnosis not present

## 2016-02-17 IMAGING — CT CT CHEST W/O CM
3 of 4 series · 17 of 30 positions shown, 19 images · non-contrast
Comparison: None.

CLINICAL DATA: Productive cough for 6 months.  Sinus congestion.

EXAM:
CT CHEST WITHOUT CONTRAST
TECHNIQUE: Multidetector CT imaging of the chest was performed following the
standard protocol without IV contrast..

[Series 3: chest w/o · axial · non-contrast · 0.72mm/px · z∈[-256,-51]mm · 4 of 69 slices shown]
[im 14/69  lung]
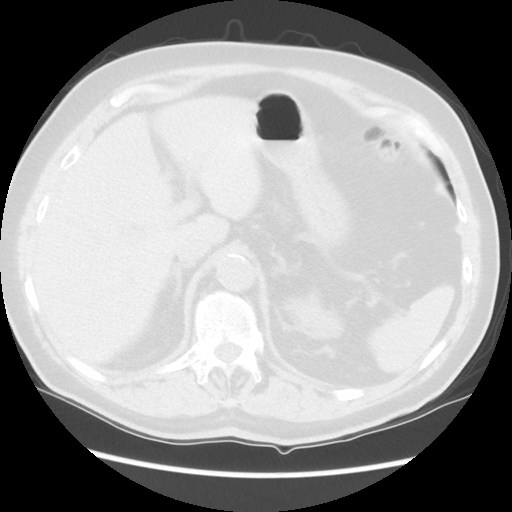
[im 28/69  lung]
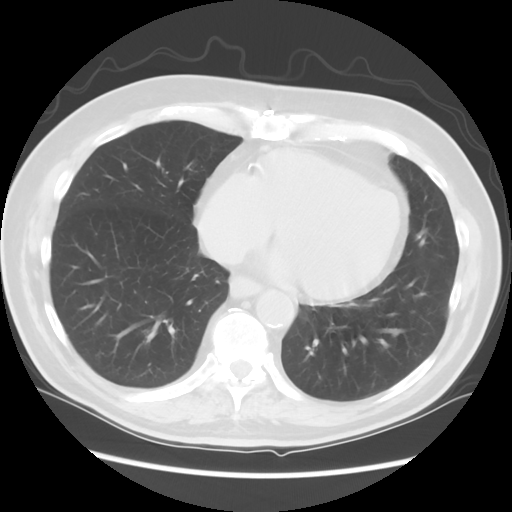
[im 41/69  lung]
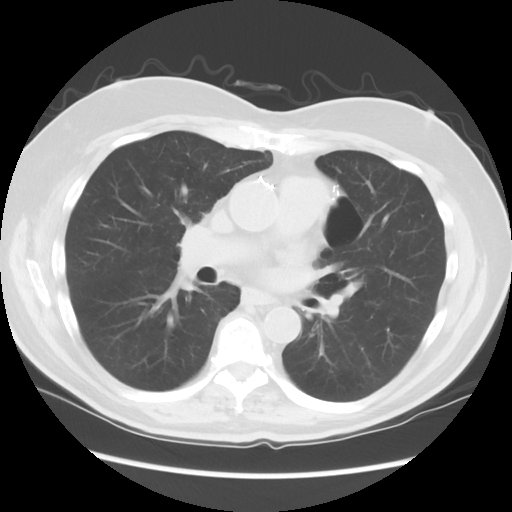
[im 55/69  lung]
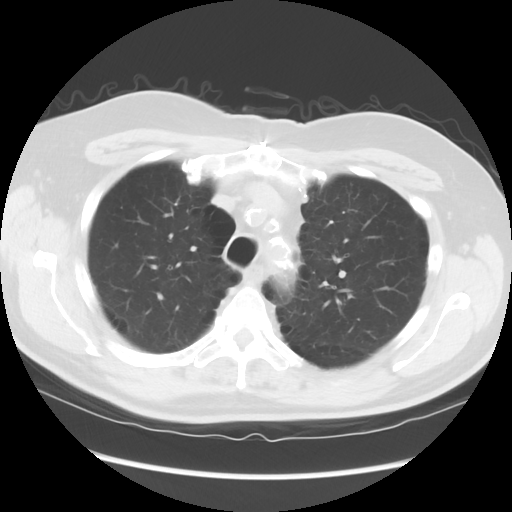

[Series 4: lung windows · axial · 0.72mm/px · z∈[-266,-36]mm · 5 of 70 slices shown, 7 images]
[im 12/70  mediastinal]
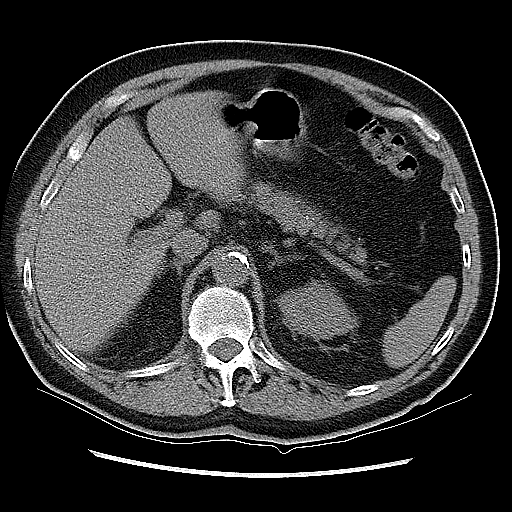
[im 12/70  lung]
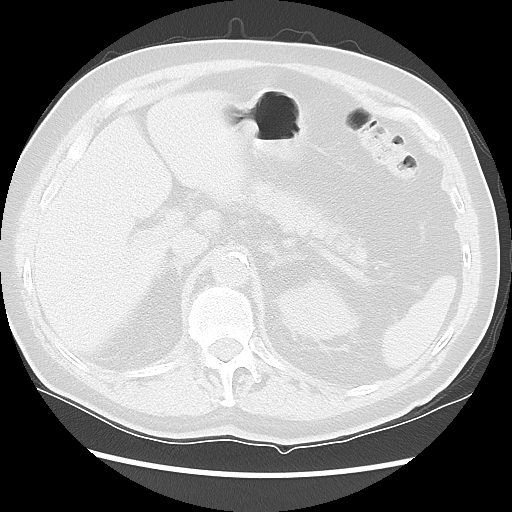
[im 24/70  lung]
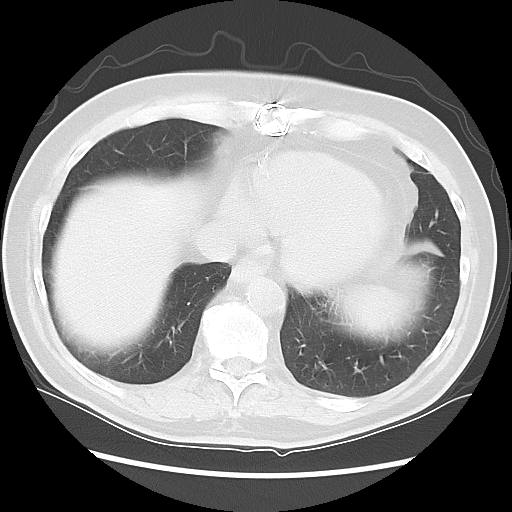
[im 35/70  lung]
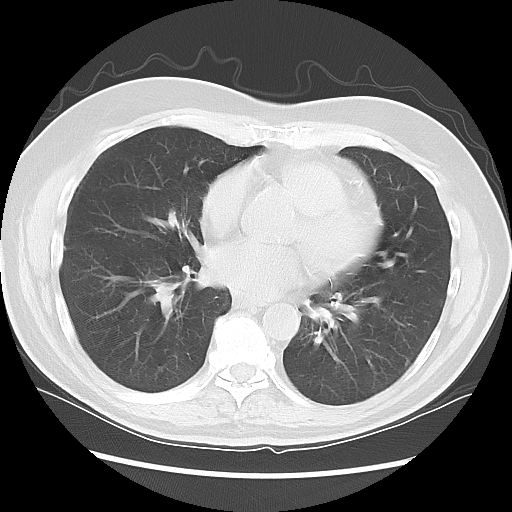
[im 47/70  lung]
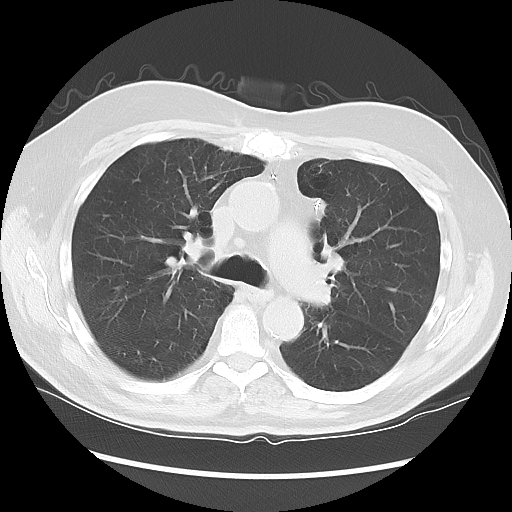
[im 58/70  mediastinal]
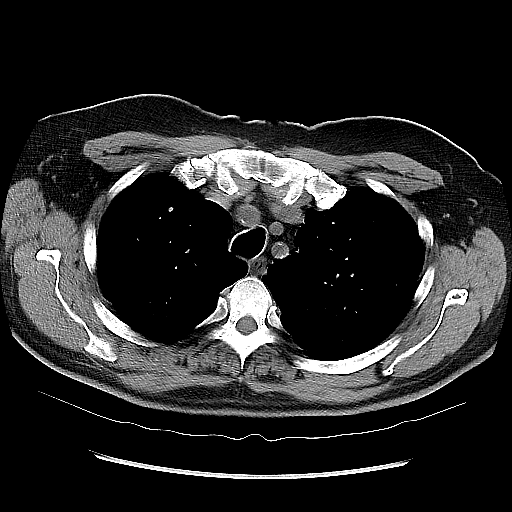
[im 58/70  lung]
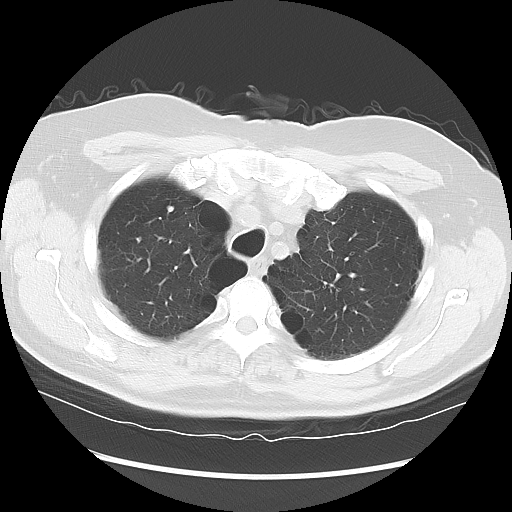

[Series 602: sagittal body · sagittal · 0.72mm/px · 8 of 150 slices shown]
[im 11/150  mediastinal]
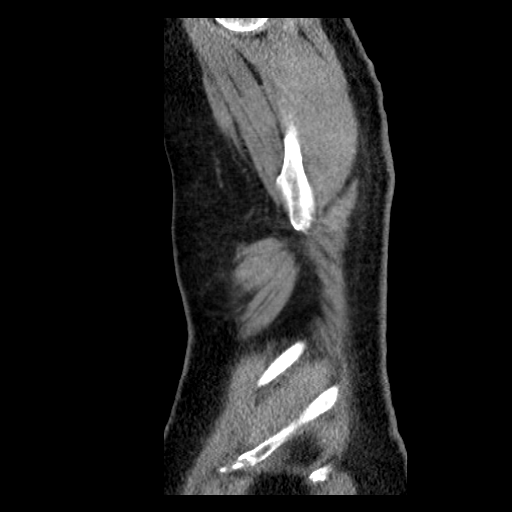
[im 32/150  mediastinal]
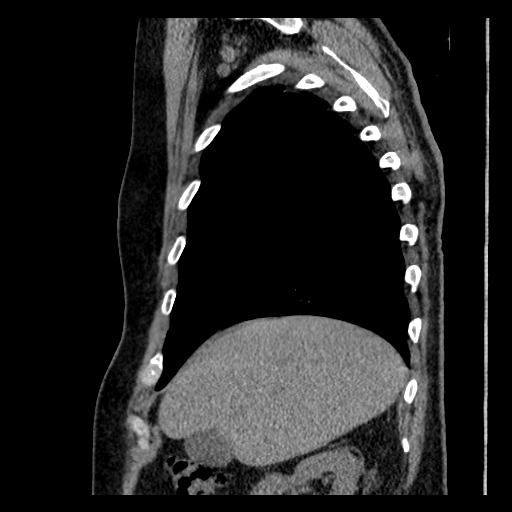
[im 54/150  mediastinal]
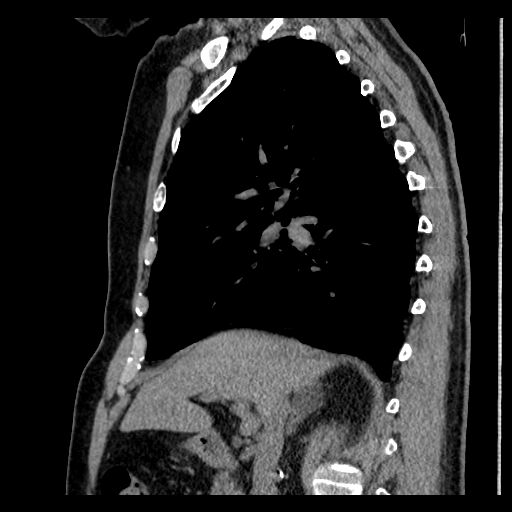
[im 64/150  mediastinal]
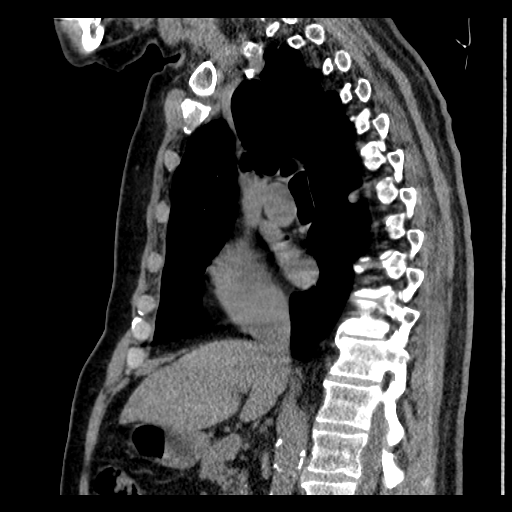
[im 86/150  mediastinal]
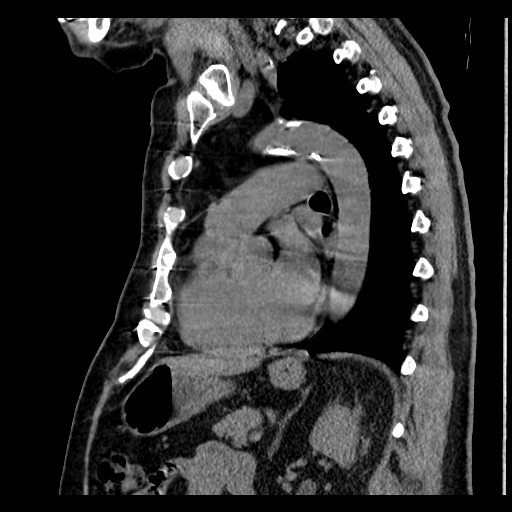
[im 96/150  mediastinal]
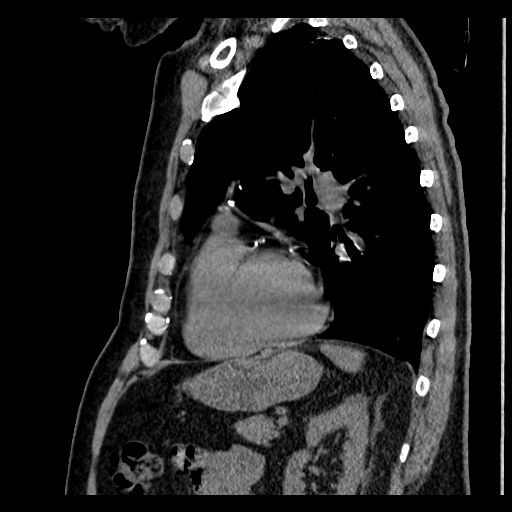
[im 118/150  mediastinal]
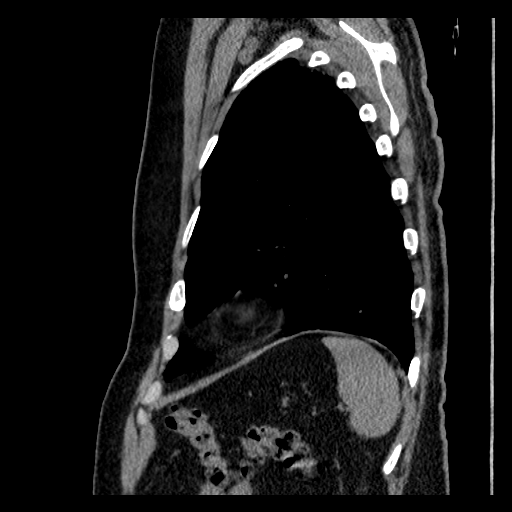
[im 139/150  mediastinal]
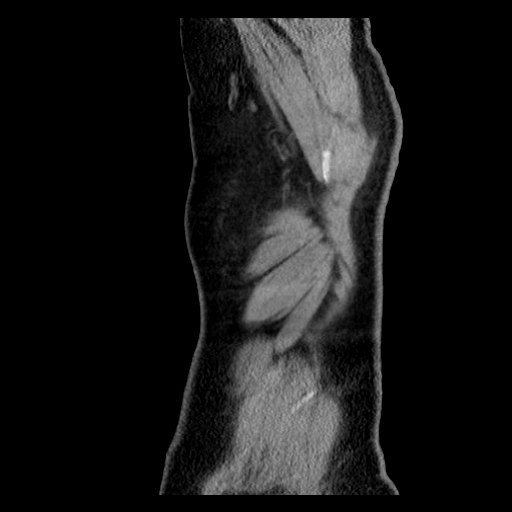

[17 of 30 positions shown; findings below may reference images not displayed]

FINDINGS: The central airways are patent. There are biapical blebs. There is
no pleural effusion or pneumothorax.

There are no pathologically enlarged axillary, hilar or mediastinal
lymph nodes.

The heart size is normal. There is no pericardial effusion. The
thoracic aorta is normal in caliber. There is evidence of prior
CABG. There is atherosclerotic disease at the origins of the branch
vessels arising from the aortic arch.

Review of bone windows demonstrates no focal lytic or sclerotic
lesions.

Limited non-contrast images of the upper abdomen were obtained. The
adrenal glands appear normal. The remainder of the visualized
abdominal organs are unremarkable.
IMPRESSION: 1. No active cardiopulmonary disease.

## 2016-05-18 DIAGNOSIS — Z720 Tobacco use: Secondary | ICD-10-CM | POA: Diagnosis not present

## 2016-05-18 DIAGNOSIS — Z1389 Encounter for screening for other disorder: Secondary | ICD-10-CM | POA: Diagnosis not present

## 2016-05-18 DIAGNOSIS — I10 Essential (primary) hypertension: Secondary | ICD-10-CM | POA: Diagnosis not present

## 2016-05-18 DIAGNOSIS — I2510 Atherosclerotic heart disease of native coronary artery without angina pectoris: Secondary | ICD-10-CM | POA: Diagnosis not present

## 2016-05-18 DIAGNOSIS — Z6826 Body mass index (BMI) 26.0-26.9, adult: Secondary | ICD-10-CM | POA: Diagnosis not present

## 2016-05-18 DIAGNOSIS — E1151 Type 2 diabetes mellitus with diabetic peripheral angiopathy without gangrene: Secondary | ICD-10-CM | POA: Diagnosis not present

## 2016-05-18 DIAGNOSIS — R0989 Other specified symptoms and signs involving the circulatory and respiratory systems: Secondary | ICD-10-CM | POA: Diagnosis not present

## 2016-09-18 DIAGNOSIS — E1151 Type 2 diabetes mellitus with diabetic peripheral angiopathy without gangrene: Secondary | ICD-10-CM | POA: Diagnosis not present

## 2016-09-18 DIAGNOSIS — Z125 Encounter for screening for malignant neoplasm of prostate: Secondary | ICD-10-CM | POA: Diagnosis not present

## 2016-09-18 DIAGNOSIS — E784 Other hyperlipidemia: Secondary | ICD-10-CM | POA: Diagnosis not present

## 2016-09-26 DIAGNOSIS — Z1389 Encounter for screening for other disorder: Secondary | ICD-10-CM | POA: Diagnosis not present

## 2016-09-26 DIAGNOSIS — Z720 Tobacco use: Secondary | ICD-10-CM | POA: Diagnosis not present

## 2016-09-26 DIAGNOSIS — I7389 Other specified peripheral vascular diseases: Secondary | ICD-10-CM | POA: Diagnosis not present

## 2016-09-26 DIAGNOSIS — Z1212 Encounter for screening for malignant neoplasm of rectum: Secondary | ICD-10-CM | POA: Diagnosis not present

## 2016-09-26 DIAGNOSIS — E784 Other hyperlipidemia: Secondary | ICD-10-CM | POA: Diagnosis not present

## 2016-09-26 DIAGNOSIS — Z0000 Encounter for general adult medical examination without abnormal findings: Secondary | ICD-10-CM | POA: Diagnosis not present

## 2016-09-26 DIAGNOSIS — E1151 Type 2 diabetes mellitus with diabetic peripheral angiopathy without gangrene: Secondary | ICD-10-CM | POA: Diagnosis not present

## 2016-09-26 DIAGNOSIS — I10 Essential (primary) hypertension: Secondary | ICD-10-CM | POA: Diagnosis not present

## 2016-09-26 DIAGNOSIS — Z6825 Body mass index (BMI) 25.0-25.9, adult: Secondary | ICD-10-CM | POA: Diagnosis not present

## 2016-09-26 DIAGNOSIS — I2510 Atherosclerotic heart disease of native coronary artery without angina pectoris: Secondary | ICD-10-CM | POA: Diagnosis not present

## 2016-12-26 DIAGNOSIS — Z6826 Body mass index (BMI) 26.0-26.9, adult: Secondary | ICD-10-CM | POA: Diagnosis not present

## 2016-12-26 DIAGNOSIS — E1151 Type 2 diabetes mellitus with diabetic peripheral angiopathy without gangrene: Secondary | ICD-10-CM | POA: Diagnosis not present

## 2016-12-26 DIAGNOSIS — I10 Essential (primary) hypertension: Secondary | ICD-10-CM | POA: Diagnosis not present

## 2016-12-26 DIAGNOSIS — Z794 Long term (current) use of insulin: Secondary | ICD-10-CM | POA: Diagnosis not present

## 2017-03-27 DIAGNOSIS — Z1389 Encounter for screening for other disorder: Secondary | ICD-10-CM | POA: Diagnosis not present

## 2017-03-27 DIAGNOSIS — Z6826 Body mass index (BMI) 26.0-26.9, adult: Secondary | ICD-10-CM | POA: Diagnosis not present

## 2017-03-27 DIAGNOSIS — Z23 Encounter for immunization: Secondary | ICD-10-CM | POA: Diagnosis not present

## 2017-03-27 DIAGNOSIS — R0989 Other specified symptoms and signs involving the circulatory and respiratory systems: Secondary | ICD-10-CM | POA: Diagnosis not present

## 2017-03-27 DIAGNOSIS — E1151 Type 2 diabetes mellitus with diabetic peripheral angiopathy without gangrene: Secondary | ICD-10-CM | POA: Diagnosis not present

## 2017-03-27 DIAGNOSIS — I10 Essential (primary) hypertension: Secondary | ICD-10-CM | POA: Diagnosis not present

## 2017-03-27 DIAGNOSIS — Z794 Long term (current) use of insulin: Secondary | ICD-10-CM | POA: Diagnosis not present

## 2017-03-27 DIAGNOSIS — E7849 Other hyperlipidemia: Secondary | ICD-10-CM | POA: Diagnosis not present

## 2017-03-27 DIAGNOSIS — I7389 Other specified peripheral vascular diseases: Secondary | ICD-10-CM | POA: Diagnosis not present

## 2017-03-27 DIAGNOSIS — I2510 Atherosclerotic heart disease of native coronary artery without angina pectoris: Secondary | ICD-10-CM | POA: Diagnosis not present

## 2017-04-17 ENCOUNTER — Other Ambulatory Visit (HOSPITAL_COMMUNITY): Payer: Self-pay | Admitting: Internal Medicine

## 2017-04-17 ENCOUNTER — Ambulatory Visit (HOSPITAL_COMMUNITY)
Admission: RE | Admit: 2017-04-17 | Discharge: 2017-04-17 | Disposition: A | Discharge status: Home / Self Care | Payer: Medicare Other | Source: Ambulatory Visit | Attending: Vascular Surgery | Admitting: Vascular Surgery

## 2017-04-17 DIAGNOSIS — R0989 Other specified symptoms and signs involving the circulatory and respiratory systems: Secondary | ICD-10-CM | POA: Insufficient documentation

## 2017-04-17 DIAGNOSIS — I6523 Occlusion and stenosis of bilateral carotid arteries: Secondary | ICD-10-CM | POA: Insufficient documentation

## 2017-04-30 DIAGNOSIS — E119 Type 2 diabetes mellitus without complications: Secondary | ICD-10-CM | POA: Diagnosis not present

## 2017-05-08 DIAGNOSIS — Z6826 Body mass index (BMI) 26.0-26.9, adult: Secondary | ICD-10-CM | POA: Diagnosis not present

## 2017-05-08 DIAGNOSIS — C44310 Basal cell carcinoma of skin of unspecified parts of face: Secondary | ICD-10-CM | POA: Diagnosis not present

## 2017-05-08 DIAGNOSIS — I10 Essential (primary) hypertension: Secondary | ICD-10-CM | POA: Diagnosis not present

## 2017-05-08 DIAGNOSIS — E1151 Type 2 diabetes mellitus with diabetic peripheral angiopathy without gangrene: Secondary | ICD-10-CM | POA: Diagnosis not present

## 2017-05-08 DIAGNOSIS — Z794 Long term (current) use of insulin: Secondary | ICD-10-CM | POA: Diagnosis not present

## 2017-05-24 DIAGNOSIS — L720 Epidermal cyst: Secondary | ICD-10-CM | POA: Diagnosis not present

## 2017-05-24 DIAGNOSIS — L821 Other seborrheic keratosis: Secondary | ICD-10-CM | POA: Diagnosis not present

## 2017-05-24 DIAGNOSIS — L570 Actinic keratosis: Secondary | ICD-10-CM | POA: Diagnosis not present

## 2017-08-01 DIAGNOSIS — Z720 Tobacco use: Secondary | ICD-10-CM | POA: Diagnosis not present

## 2017-08-01 DIAGNOSIS — Z6825 Body mass index (BMI) 25.0-25.9, adult: Secondary | ICD-10-CM | POA: Diagnosis not present

## 2017-08-01 DIAGNOSIS — E7849 Other hyperlipidemia: Secondary | ICD-10-CM | POA: Diagnosis not present

## 2017-08-01 DIAGNOSIS — E1151 Type 2 diabetes mellitus with diabetic peripheral angiopathy without gangrene: Secondary | ICD-10-CM | POA: Diagnosis not present

## 2017-08-01 DIAGNOSIS — I7389 Other specified peripheral vascular diseases: Secondary | ICD-10-CM | POA: Diagnosis not present

## 2017-08-01 DIAGNOSIS — I10 Essential (primary) hypertension: Secondary | ICD-10-CM | POA: Diagnosis not present

## 2017-08-01 DIAGNOSIS — Z794 Long term (current) use of insulin: Secondary | ICD-10-CM | POA: Diagnosis not present

## 2017-08-01 DIAGNOSIS — I2510 Atherosclerotic heart disease of native coronary artery without angina pectoris: Secondary | ICD-10-CM | POA: Diagnosis not present

## 2017-09-20 DIAGNOSIS — E1151 Type 2 diabetes mellitus with diabetic peripheral angiopathy without gangrene: Secondary | ICD-10-CM | POA: Diagnosis not present

## 2017-09-20 DIAGNOSIS — E11621 Type 2 diabetes mellitus with foot ulcer: Secondary | ICD-10-CM | POA: Diagnosis not present

## 2017-09-20 DIAGNOSIS — I10 Essential (primary) hypertension: Secondary | ICD-10-CM | POA: Diagnosis not present

## 2017-09-20 DIAGNOSIS — I739 Peripheral vascular disease, unspecified: Secondary | ICD-10-CM | POA: Diagnosis not present

## 2017-09-20 DIAGNOSIS — Z794 Long term (current) use of insulin: Secondary | ICD-10-CM | POA: Diagnosis not present

## 2017-09-27 DIAGNOSIS — E11621 Type 2 diabetes mellitus with foot ulcer: Secondary | ICD-10-CM | POA: Diagnosis not present

## 2017-09-27 DIAGNOSIS — Z6825 Body mass index (BMI) 25.0-25.9, adult: Secondary | ICD-10-CM | POA: Diagnosis not present

## 2017-11-05 DIAGNOSIS — Z125 Encounter for screening for malignant neoplasm of prostate: Secondary | ICD-10-CM | POA: Diagnosis not present

## 2017-11-05 DIAGNOSIS — E1151 Type 2 diabetes mellitus with diabetic peripheral angiopathy without gangrene: Secondary | ICD-10-CM | POA: Diagnosis not present

## 2017-11-05 DIAGNOSIS — I10 Essential (primary) hypertension: Secondary | ICD-10-CM | POA: Diagnosis not present

## 2017-11-05 DIAGNOSIS — R82998 Other abnormal findings in urine: Secondary | ICD-10-CM | POA: Diagnosis not present

## 2017-11-05 DIAGNOSIS — E7849 Other hyperlipidemia: Secondary | ICD-10-CM | POA: Diagnosis not present

## 2017-11-09 DIAGNOSIS — Z1389 Encounter for screening for other disorder: Secondary | ICD-10-CM | POA: Diagnosis not present

## 2017-11-09 DIAGNOSIS — I2510 Atherosclerotic heart disease of native coronary artery without angina pectoris: Secondary | ICD-10-CM | POA: Diagnosis not present

## 2017-11-09 DIAGNOSIS — Z23 Encounter for immunization: Secondary | ICD-10-CM | POA: Diagnosis not present

## 2017-11-09 DIAGNOSIS — E1151 Type 2 diabetes mellitus with diabetic peripheral angiopathy without gangrene: Secondary | ICD-10-CM | POA: Diagnosis not present

## 2017-11-09 DIAGNOSIS — Z794 Long term (current) use of insulin: Secondary | ICD-10-CM | POA: Diagnosis not present

## 2017-11-09 DIAGNOSIS — I10 Essential (primary) hypertension: Secondary | ICD-10-CM | POA: Diagnosis not present

## 2017-11-09 DIAGNOSIS — Z720 Tobacco use: Secondary | ICD-10-CM | POA: Diagnosis not present

## 2017-11-09 DIAGNOSIS — Z6825 Body mass index (BMI) 25.0-25.9, adult: Secondary | ICD-10-CM | POA: Diagnosis not present

## 2017-11-09 DIAGNOSIS — E7849 Other hyperlipidemia: Secondary | ICD-10-CM | POA: Diagnosis not present

## 2017-11-09 DIAGNOSIS — L988 Other specified disorders of the skin and subcutaneous tissue: Secondary | ICD-10-CM | POA: Diagnosis not present

## 2017-11-09 DIAGNOSIS — Z0000 Encounter for general adult medical examination without abnormal findings: Secondary | ICD-10-CM | POA: Diagnosis not present

## 2017-11-14 DIAGNOSIS — D1801 Hemangioma of skin and subcutaneous tissue: Secondary | ICD-10-CM | POA: Diagnosis not present

## 2017-11-14 DIAGNOSIS — L821 Other seborrheic keratosis: Secondary | ICD-10-CM | POA: Diagnosis not present

## 2017-11-14 DIAGNOSIS — L570 Actinic keratosis: Secondary | ICD-10-CM | POA: Diagnosis not present

## 2017-11-14 DIAGNOSIS — D485 Neoplasm of uncertain behavior of skin: Secondary | ICD-10-CM | POA: Diagnosis not present

## 2017-11-14 DIAGNOSIS — D225 Melanocytic nevi of trunk: Secondary | ICD-10-CM | POA: Diagnosis not present

## 2017-12-19 DIAGNOSIS — Z794 Long term (current) use of insulin: Secondary | ICD-10-CM | POA: Diagnosis not present

## 2017-12-19 DIAGNOSIS — I10 Essential (primary) hypertension: Secondary | ICD-10-CM | POA: Diagnosis not present

## 2017-12-19 DIAGNOSIS — E1151 Type 2 diabetes mellitus with diabetic peripheral angiopathy without gangrene: Secondary | ICD-10-CM | POA: Diagnosis not present

## 2017-12-19 DIAGNOSIS — Z6826 Body mass index (BMI) 26.0-26.9, adult: Secondary | ICD-10-CM | POA: Diagnosis not present

## 2018-02-14 DIAGNOSIS — E1151 Type 2 diabetes mellitus with diabetic peripheral angiopathy without gangrene: Secondary | ICD-10-CM | POA: Diagnosis not present

## 2018-02-14 DIAGNOSIS — I10 Essential (primary) hypertension: Secondary | ICD-10-CM | POA: Diagnosis not present

## 2018-02-14 DIAGNOSIS — E7849 Other hyperlipidemia: Secondary | ICD-10-CM | POA: Diagnosis not present

## 2018-02-14 DIAGNOSIS — I7389 Other specified peripheral vascular diseases: Secondary | ICD-10-CM | POA: Diagnosis not present

## 2018-02-14 DIAGNOSIS — Z6826 Body mass index (BMI) 26.0-26.9, adult: Secondary | ICD-10-CM | POA: Diagnosis not present

## 2018-02-14 DIAGNOSIS — I2510 Atherosclerotic heart disease of native coronary artery without angina pectoris: Secondary | ICD-10-CM | POA: Diagnosis not present

## 2018-02-14 DIAGNOSIS — R0989 Other specified symptoms and signs involving the circulatory and respiratory systems: Secondary | ICD-10-CM | POA: Diagnosis not present

## 2018-02-14 DIAGNOSIS — Z794 Long term (current) use of insulin: Secondary | ICD-10-CM | POA: Diagnosis not present

## 2018-02-14 DIAGNOSIS — Z720 Tobacco use: Secondary | ICD-10-CM | POA: Diagnosis not present

## 2018-04-04 DIAGNOSIS — Z794 Long term (current) use of insulin: Secondary | ICD-10-CM | POA: Diagnosis not present

## 2018-04-04 DIAGNOSIS — E1151 Type 2 diabetes mellitus with diabetic peripheral angiopathy without gangrene: Secondary | ICD-10-CM | POA: Diagnosis not present

## 2018-04-04 DIAGNOSIS — I10 Essential (primary) hypertension: Secondary | ICD-10-CM | POA: Diagnosis not present

## 2018-07-04 DIAGNOSIS — I2510 Atherosclerotic heart disease of native coronary artery without angina pectoris: Secondary | ICD-10-CM | POA: Diagnosis not present

## 2018-07-04 DIAGNOSIS — E1151 Type 2 diabetes mellitus with diabetic peripheral angiopathy without gangrene: Secondary | ICD-10-CM | POA: Diagnosis not present

## 2018-07-04 DIAGNOSIS — I739 Peripheral vascular disease, unspecified: Secondary | ICD-10-CM | POA: Diagnosis not present

## 2018-07-04 DIAGNOSIS — E785 Hyperlipidemia, unspecified: Secondary | ICD-10-CM | POA: Diagnosis not present

## 2018-07-04 DIAGNOSIS — Z794 Long term (current) use of insulin: Secondary | ICD-10-CM | POA: Diagnosis not present

## 2018-08-15 DIAGNOSIS — I10 Essential (primary) hypertension: Secondary | ICD-10-CM | POA: Diagnosis not present

## 2018-08-15 DIAGNOSIS — Z794 Long term (current) use of insulin: Secondary | ICD-10-CM | POA: Diagnosis not present

## 2018-08-15 DIAGNOSIS — E1151 Type 2 diabetes mellitus with diabetic peripheral angiopathy without gangrene: Secondary | ICD-10-CM | POA: Diagnosis not present

## 2018-09-30 DIAGNOSIS — H52203 Unspecified astigmatism, bilateral: Secondary | ICD-10-CM | POA: Diagnosis not present

## 2018-09-30 DIAGNOSIS — H4322 Crystalline deposits in vitreous body, left eye: Secondary | ICD-10-CM | POA: Diagnosis not present

## 2018-09-30 DIAGNOSIS — E119 Type 2 diabetes mellitus without complications: Secondary | ICD-10-CM | POA: Diagnosis not present

## 2018-09-30 DIAGNOSIS — H35373 Puckering of macula, bilateral: Secondary | ICD-10-CM | POA: Diagnosis not present

## 2018-11-14 DIAGNOSIS — Z125 Encounter for screening for malignant neoplasm of prostate: Secondary | ICD-10-CM | POA: Diagnosis not present

## 2018-11-14 DIAGNOSIS — E7849 Other hyperlipidemia: Secondary | ICD-10-CM | POA: Diagnosis not present

## 2018-11-14 DIAGNOSIS — E1151 Type 2 diabetes mellitus with diabetic peripheral angiopathy without gangrene: Secondary | ICD-10-CM | POA: Diagnosis not present

## 2018-11-21 DIAGNOSIS — Z1339 Encounter for screening examination for other mental health and behavioral disorders: Secondary | ICD-10-CM | POA: Diagnosis not present

## 2018-11-21 DIAGNOSIS — Z23 Encounter for immunization: Secondary | ICD-10-CM | POA: Diagnosis not present

## 2018-11-21 DIAGNOSIS — R82998 Other abnormal findings in urine: Secondary | ICD-10-CM | POA: Diagnosis not present

## 2018-11-21 DIAGNOSIS — Z794 Long term (current) use of insulin: Secondary | ICD-10-CM | POA: Diagnosis not present

## 2018-11-21 DIAGNOSIS — D649 Anemia, unspecified: Secondary | ICD-10-CM | POA: Diagnosis not present

## 2018-11-21 DIAGNOSIS — I739 Peripheral vascular disease, unspecified: Secondary | ICD-10-CM | POA: Diagnosis not present

## 2018-11-21 DIAGNOSIS — Z1212 Encounter for screening for malignant neoplasm of rectum: Secondary | ICD-10-CM | POA: Diagnosis not present

## 2018-11-21 DIAGNOSIS — Z0000 Encounter for general adult medical examination without abnormal findings: Secondary | ICD-10-CM | POA: Diagnosis not present

## 2018-11-21 DIAGNOSIS — I2510 Atherosclerotic heart disease of native coronary artery without angina pectoris: Secondary | ICD-10-CM | POA: Diagnosis not present

## 2018-11-21 DIAGNOSIS — E1151 Type 2 diabetes mellitus with diabetic peripheral angiopathy without gangrene: Secondary | ICD-10-CM | POA: Diagnosis not present

## 2018-11-21 DIAGNOSIS — R0989 Other specified symptoms and signs involving the circulatory and respiratory systems: Secondary | ICD-10-CM | POA: Diagnosis not present

## 2018-11-21 DIAGNOSIS — I10 Essential (primary) hypertension: Secondary | ICD-10-CM | POA: Diagnosis not present

## 2018-11-21 DIAGNOSIS — E785 Hyperlipidemia, unspecified: Secondary | ICD-10-CM | POA: Diagnosis not present

## 2019-01-29 DIAGNOSIS — E1151 Type 2 diabetes mellitus with diabetic peripheral angiopathy without gangrene: Secondary | ICD-10-CM | POA: Diagnosis not present

## 2019-01-29 DIAGNOSIS — Z794 Long term (current) use of insulin: Secondary | ICD-10-CM | POA: Diagnosis not present

## 2019-01-29 DIAGNOSIS — I10 Essential (primary) hypertension: Secondary | ICD-10-CM | POA: Diagnosis not present

## 2019-05-01 DIAGNOSIS — I10 Essential (primary) hypertension: Secondary | ICD-10-CM | POA: Diagnosis not present

## 2019-05-01 DIAGNOSIS — Z794 Long term (current) use of insulin: Secondary | ICD-10-CM | POA: Diagnosis not present

## 2019-05-01 DIAGNOSIS — E1151 Type 2 diabetes mellitus with diabetic peripheral angiopathy without gangrene: Secondary | ICD-10-CM | POA: Diagnosis not present

## 2019-10-01 DIAGNOSIS — E119 Type 2 diabetes mellitus without complications: Secondary | ICD-10-CM | POA: Diagnosis not present

## 2019-10-01 DIAGNOSIS — H52203 Unspecified astigmatism, bilateral: Secondary | ICD-10-CM | POA: Diagnosis not present

## 2019-11-25 DIAGNOSIS — Z125 Encounter for screening for malignant neoplasm of prostate: Secondary | ICD-10-CM | POA: Diagnosis not present

## 2019-11-25 DIAGNOSIS — E785 Hyperlipidemia, unspecified: Secondary | ICD-10-CM | POA: Diagnosis not present

## 2019-11-25 DIAGNOSIS — E1165 Type 2 diabetes mellitus with hyperglycemia: Secondary | ICD-10-CM | POA: Diagnosis not present

## 2019-12-02 DIAGNOSIS — Z794 Long term (current) use of insulin: Secondary | ICD-10-CM | POA: Diagnosis not present

## 2019-12-02 DIAGNOSIS — E785 Hyperlipidemia, unspecified: Secondary | ICD-10-CM | POA: Diagnosis not present

## 2019-12-02 DIAGNOSIS — I739 Peripheral vascular disease, unspecified: Secondary | ICD-10-CM | POA: Diagnosis not present

## 2019-12-02 DIAGNOSIS — R82998 Other abnormal findings in urine: Secondary | ICD-10-CM | POA: Diagnosis not present

## 2019-12-02 DIAGNOSIS — Z23 Encounter for immunization: Secondary | ICD-10-CM | POA: Diagnosis not present

## 2019-12-02 DIAGNOSIS — E1151 Type 2 diabetes mellitus with diabetic peripheral angiopathy without gangrene: Secondary | ICD-10-CM | POA: Diagnosis not present

## 2019-12-02 DIAGNOSIS — D649 Anemia, unspecified: Secondary | ICD-10-CM | POA: Diagnosis not present

## 2019-12-02 DIAGNOSIS — I2510 Atherosclerotic heart disease of native coronary artery without angina pectoris: Secondary | ICD-10-CM | POA: Diagnosis not present

## 2019-12-02 DIAGNOSIS — I10 Essential (primary) hypertension: Secondary | ICD-10-CM | POA: Diagnosis not present

## 2019-12-02 DIAGNOSIS — N529 Male erectile dysfunction, unspecified: Secondary | ICD-10-CM | POA: Diagnosis not present

## 2019-12-02 DIAGNOSIS — Z0000 Encounter for general adult medical examination without abnormal findings: Secondary | ICD-10-CM | POA: Diagnosis not present

## 2019-12-02 DIAGNOSIS — Z720 Tobacco use: Secondary | ICD-10-CM | POA: Diagnosis not present

## 2019-12-08 DIAGNOSIS — Z23 Encounter for immunization: Secondary | ICD-10-CM | POA: Diagnosis not present

## 2020-01-06 DIAGNOSIS — Z1212 Encounter for screening for malignant neoplasm of rectum: Secondary | ICD-10-CM | POA: Diagnosis not present

## 2020-04-15 DIAGNOSIS — I2510 Atherosclerotic heart disease of native coronary artery without angina pectoris: Secondary | ICD-10-CM | POA: Diagnosis not present

## 2020-04-15 DIAGNOSIS — Z794 Long term (current) use of insulin: Secondary | ICD-10-CM | POA: Diagnosis not present

## 2020-04-15 DIAGNOSIS — Z741 Need for assistance with personal care: Secondary | ICD-10-CM | POA: Diagnosis not present

## 2020-04-15 DIAGNOSIS — E1151 Type 2 diabetes mellitus with diabetic peripheral angiopathy without gangrene: Secondary | ICD-10-CM | POA: Diagnosis not present

## 2020-04-15 DIAGNOSIS — E785 Hyperlipidemia, unspecified: Secondary | ICD-10-CM | POA: Diagnosis not present

## 2020-04-15 DIAGNOSIS — I739 Peripheral vascular disease, unspecified: Secondary | ICD-10-CM | POA: Diagnosis not present

## 2020-04-15 DIAGNOSIS — Z1339 Encounter for screening examination for other mental health and behavioral disorders: Secondary | ICD-10-CM | POA: Diagnosis not present

## 2020-04-15 DIAGNOSIS — I10 Essential (primary) hypertension: Secondary | ICD-10-CM | POA: Diagnosis not present

## 2020-04-15 DIAGNOSIS — Z1331 Encounter for screening for depression: Secondary | ICD-10-CM | POA: Diagnosis not present

## 2020-07-20 DIAGNOSIS — I2510 Atherosclerotic heart disease of native coronary artery without angina pectoris: Secondary | ICD-10-CM | POA: Diagnosis not present

## 2020-07-20 DIAGNOSIS — E785 Hyperlipidemia, unspecified: Secondary | ICD-10-CM | POA: Diagnosis not present

## 2020-07-20 DIAGNOSIS — I739 Peripheral vascular disease, unspecified: Secondary | ICD-10-CM | POA: Diagnosis not present

## 2020-07-20 DIAGNOSIS — N1831 Chronic kidney disease, stage 3a: Secondary | ICD-10-CM | POA: Diagnosis not present

## 2020-07-20 DIAGNOSIS — E1151 Type 2 diabetes mellitus with diabetic peripheral angiopathy without gangrene: Secondary | ICD-10-CM | POA: Diagnosis not present

## 2020-07-20 DIAGNOSIS — Z794 Long term (current) use of insulin: Secondary | ICD-10-CM | POA: Diagnosis not present

## 2020-07-20 DIAGNOSIS — R0989 Other specified symptoms and signs involving the circulatory and respiratory systems: Secondary | ICD-10-CM | POA: Diagnosis not present

## 2020-07-20 DIAGNOSIS — I1310 Hypertensive heart and chronic kidney disease without heart failure, with stage 1 through stage 4 chronic kidney disease, or unspecified chronic kidney disease: Secondary | ICD-10-CM | POA: Diagnosis not present

## 2020-07-27 ENCOUNTER — Other Ambulatory Visit (HOSPITAL_COMMUNITY): Payer: Self-pay | Admitting: Internal Medicine

## 2020-07-27 DIAGNOSIS — R0989 Other specified symptoms and signs involving the circulatory and respiratory systems: Secondary | ICD-10-CM

## 2020-07-28 ENCOUNTER — Ambulatory Visit (HOSPITAL_COMMUNITY)
Admission: RE | Admit: 2020-07-28 | Discharge: 2020-07-28 | Disposition: A | Discharge status: Home / Self Care | Payer: Medicare HMO | Source: Ambulatory Visit | Attending: Internal Medicine | Admitting: Internal Medicine

## 2020-07-28 ENCOUNTER — Other Ambulatory Visit: Payer: Self-pay

## 2020-07-28 DIAGNOSIS — R0989 Other specified symptoms and signs involving the circulatory and respiratory systems: Secondary | ICD-10-CM | POA: Insufficient documentation

## 2020-08-20 DIAGNOSIS — I1310 Hypertensive heart and chronic kidney disease without heart failure, with stage 1 through stage 4 chronic kidney disease, or unspecified chronic kidney disease: Secondary | ICD-10-CM | POA: Diagnosis not present

## 2020-08-20 DIAGNOSIS — N1831 Chronic kidney disease, stage 3a: Secondary | ICD-10-CM | POA: Diagnosis not present

## 2020-08-20 DIAGNOSIS — I10 Essential (primary) hypertension: Secondary | ICD-10-CM | POA: Diagnosis not present

## 2020-08-20 DIAGNOSIS — M545 Low back pain, unspecified: Secondary | ICD-10-CM | POA: Diagnosis not present

## 2020-08-20 DIAGNOSIS — J069 Acute upper respiratory infection, unspecified: Secondary | ICD-10-CM | POA: Diagnosis not present

## 2020-08-20 DIAGNOSIS — R051 Acute cough: Secondary | ICD-10-CM | POA: Diagnosis not present

## 2020-10-15 DIAGNOSIS — H52203 Unspecified astigmatism, bilateral: Secondary | ICD-10-CM | POA: Diagnosis not present

## 2020-10-15 DIAGNOSIS — H35373 Puckering of macula, bilateral: Secondary | ICD-10-CM | POA: Diagnosis not present

## 2020-10-15 DIAGNOSIS — E119 Type 2 diabetes mellitus without complications: Secondary | ICD-10-CM | POA: Diagnosis not present

## 2020-12-03 DIAGNOSIS — E1151 Type 2 diabetes mellitus with diabetic peripheral angiopathy without gangrene: Secondary | ICD-10-CM | POA: Diagnosis not present

## 2020-12-03 DIAGNOSIS — E785 Hyperlipidemia, unspecified: Secondary | ICD-10-CM | POA: Diagnosis not present

## 2020-12-03 DIAGNOSIS — Z125 Encounter for screening for malignant neoplasm of prostate: Secondary | ICD-10-CM | POA: Diagnosis not present

## 2020-12-13 DIAGNOSIS — R809 Proteinuria, unspecified: Secondary | ICD-10-CM | POA: Diagnosis not present

## 2020-12-13 DIAGNOSIS — I1310 Hypertensive heart and chronic kidney disease without heart failure, with stage 1 through stage 4 chronic kidney disease, or unspecified chronic kidney disease: Secondary | ICD-10-CM | POA: Diagnosis not present

## 2020-12-13 DIAGNOSIS — I2510 Atherosclerotic heart disease of native coronary artery without angina pectoris: Secondary | ICD-10-CM | POA: Diagnosis not present

## 2020-12-13 DIAGNOSIS — I10 Essential (primary) hypertension: Secondary | ICD-10-CM | POA: Diagnosis not present

## 2020-12-13 DIAGNOSIS — N1831 Chronic kidney disease, stage 3a: Secondary | ICD-10-CM | POA: Diagnosis not present

## 2020-12-13 DIAGNOSIS — I739 Peripheral vascular disease, unspecified: Secondary | ICD-10-CM | POA: Diagnosis not present

## 2020-12-13 DIAGNOSIS — Z794 Long term (current) use of insulin: Secondary | ICD-10-CM | POA: Diagnosis not present

## 2020-12-13 DIAGNOSIS — E1151 Type 2 diabetes mellitus with diabetic peripheral angiopathy without gangrene: Secondary | ICD-10-CM | POA: Diagnosis not present

## 2020-12-13 DIAGNOSIS — E785 Hyperlipidemia, unspecified: Secondary | ICD-10-CM | POA: Diagnosis not present

## 2020-12-13 DIAGNOSIS — R82998 Other abnormal findings in urine: Secondary | ICD-10-CM | POA: Diagnosis not present

## 2020-12-13 DIAGNOSIS — Z0000 Encounter for general adult medical examination without abnormal findings: Secondary | ICD-10-CM | POA: Diagnosis not present

## 2020-12-13 DIAGNOSIS — Z23 Encounter for immunization: Secondary | ICD-10-CM | POA: Diagnosis not present

## 2020-12-16 DIAGNOSIS — D485 Neoplasm of uncertain behavior of skin: Secondary | ICD-10-CM | POA: Diagnosis not present

## 2020-12-16 DIAGNOSIS — L98499 Non-pressure chronic ulcer of skin of other sites with unspecified severity: Secondary | ICD-10-CM | POA: Diagnosis not present

## 2021-01-26 DIAGNOSIS — R5383 Other fatigue: Secondary | ICD-10-CM | POA: Diagnosis not present

## 2021-01-26 DIAGNOSIS — R0981 Nasal congestion: Secondary | ICD-10-CM | POA: Diagnosis not present

## 2021-01-26 DIAGNOSIS — Z1152 Encounter for screening for COVID-19: Secondary | ICD-10-CM | POA: Diagnosis not present

## 2021-01-26 DIAGNOSIS — N1831 Chronic kidney disease, stage 3a: Secondary | ICD-10-CM | POA: Diagnosis not present

## 2021-01-26 DIAGNOSIS — R051 Acute cough: Secondary | ICD-10-CM | POA: Diagnosis not present

## 2021-01-26 DIAGNOSIS — I1310 Hypertensive heart and chronic kidney disease without heart failure, with stage 1 through stage 4 chronic kidney disease, or unspecified chronic kidney disease: Secondary | ICD-10-CM | POA: Diagnosis not present

## 2021-01-26 DIAGNOSIS — R062 Wheezing: Secondary | ICD-10-CM | POA: Diagnosis not present

## 2021-01-26 DIAGNOSIS — J069 Acute upper respiratory infection, unspecified: Secondary | ICD-10-CM | POA: Diagnosis not present

## 2021-06-13 DIAGNOSIS — E1151 Type 2 diabetes mellitus with diabetic peripheral angiopathy without gangrene: Secondary | ICD-10-CM | POA: Diagnosis not present

## 2021-06-13 DIAGNOSIS — Z794 Long term (current) use of insulin: Secondary | ICD-10-CM | POA: Diagnosis not present

## 2021-06-13 DIAGNOSIS — I739 Peripheral vascular disease, unspecified: Secondary | ICD-10-CM | POA: Diagnosis not present

## 2021-06-13 DIAGNOSIS — E785 Hyperlipidemia, unspecified: Secondary | ICD-10-CM | POA: Diagnosis not present

## 2021-06-13 DIAGNOSIS — I2510 Atherosclerotic heart disease of native coronary artery without angina pectoris: Secondary | ICD-10-CM | POA: Diagnosis not present

## 2021-06-13 DIAGNOSIS — R0989 Other specified symptoms and signs involving the circulatory and respiratory systems: Secondary | ICD-10-CM | POA: Diagnosis not present

## 2021-06-13 DIAGNOSIS — I1310 Hypertensive heart and chronic kidney disease without heart failure, with stage 1 through stage 4 chronic kidney disease, or unspecified chronic kidney disease: Secondary | ICD-10-CM | POA: Diagnosis not present

## 2021-07-10 NOTE — Progress Notes (Unsigned)
Cardiology Office Note:    Date:  07/11/2021   ID:  Leonard Seal Sr., DOB July 11, 1940, MRN [id number]  PCP:  Donnajean Lopes, MD  Cardiologist:  Sinclair Grooms, MD   Referring MD: Donnajean Lopes, MD   Chief Complaint  Patient presents with   Coronary Artery Disease   Hypertension   Hyperlipidemia    History of Present Illness:    Leonard DAFFIN Sr. is a 81 y.o. male with a hx of coronary artery disease, prior bypass surgery 12/1998, history of bilateral carotid disease right greater than left, hypertension, hyperlipidemia, and diabetes mellitus, type II with complications.   He gets a vague tingling in the left jaw left side of his neck and into the shoulder.  Occurs while driving long distances or if sitting watching TV although no obvious precipitant.  If he changes position he gets better.  He denies orthopnea, PND, chest pain.  Maintaining a relatively active lifestyle without any change in endurance.  Has not resumed golf after the pandemic.  He denies orthopnea and PND.  Past Medical History:  Diagnosis Date   Allergy    Angiodysplasia of cecum 12/27/2015   Bilateral carotid bruits    Coronary atherosclerosis    Diabetes mellitus without complication ([place])    Diverticulosis    Dysplastic nevus    Hyperlipidemia    Hypertension    Migraine headache with aura    Myocardial infarction ([place]) 2002   Skin mole    Vitamin B12 deficiency     Past Surgical History:  Procedure Laterality Date   CATARACT EXTRACTION     COLONOSCOPY  07/13/2005, 12/27/15   gessner- tics only    CORONARY ARTERY BYPASS GRAFT     cabg x 5     Current Medications: Current Meds  Medication Sig   amLODipine (NORVASC) 5 MG tablet Take 5 mg by mouth daily.   aspirin 81 MG tablet Take 81 mg by mouth daily.   Ca Carbonate-Mag Hydroxide (ROLAIDS PO) Take 1-2 tablets by mouth as needed (indigestion).   carvedilol (COREG) 12.5 MG tablet Take 12.5 mg by mouth 2 (two) times daily.    insulin glargine (LANTUS) 100 UNIT/ML injection Inject 15 Units into the skin daily. 15 units in morning and 5 units at night   insulin lispro (HUMALOG) 100 UNIT/ML injection Inject 10 Units into the skin 2 (two) times daily with a meal.    losartan-hydrochlorothiazide (HYZAAR) 100-12.5 MG per tablet Take 1 tablet by mouth daily.   metFORMIN (GLUCOPHAGE) 500 MG tablet Take by mouth 2 (two) times daily with a meal.   NITROSTAT 0.4 MG SL tablet Place 0.4 mg under the tongue every 5 (five) minutes as needed for chest pain.    rosuvastatin (CRESTOR) 20 MG tablet Take 20 mg by mouth at bedtime.   vitamin B-12 (CYANOCOBALAMIN) 500 MCG tablet Take 500 mcg by mouth daily.   [DISCONTINUED] amLODipine (NORVASC) 2.5 MG tablet Take 2.5 mg by mouth daily.   Current Facility-Administered Medications for the 07/11/21 encounter (Office Visit) with Belva Crome, MD  Medication   0.9 %  sodium chloride infusion     Allergies:   Penicillins   Social History   Socioeconomic History   Marital status: Married    Spouse name: Not on file   Number of children: Not on file   Years of education: Not on file   Highest education level: Not on file  Occupational History   Not on  file  Tobacco Use   Smoking status: Every Day    Types: Cigars   Smokeless tobacco: Never   Tobacco comments:    occ cigars  Substance and Sexual Activity   Alcohol use: Yes    Comment: occasionally    Drug use: No   Sexual activity: Not on file  Other Topics Concern   Not on file  Social History Narrative   Not on file   Social Determinants of Health   Financial Resource Strain: Not on file  Food Insecurity: Not on file  Transportation Needs: Not on file  Physical Activity: Not on file  Stress: Not on file  Social Connections: Not on file     Family History: The patient's family history includes Anorexia nervosa in his sister; Cancer in his father; Diabetes in his brother, brother, and mother; Healthy in his brother  and brother; Heart disease in his brother; Other in his brother; Stomach cancer in his father. There is no history of Colon cancer, Esophageal cancer, Rectal cancer, Pancreatic cancer, or Prostate cancer.  ROS:   Please see the history of present illness.    Other than the left neck complaints, there are no symptoms.  No medication side effects.  He is aware that he has bilateral carotid bruits.  This was also mentioned in the report received from Dr. Sharlett Iles.  All other systems reviewed and are negative.  EKGs/Labs/Other Studies Reviewed:    The following studies were reviewed today: CAROTID DOPPLER 07/28/2020: Summary:  Right Carotid: Velocities in the right ICA are consistent with a 1-39%  stenosis.                 Velocities may be underestimated due to calcific plaque  with                 acoustic shadowing.   Left Carotid: Velocities in the left ICA are consistent with a 1-39%  stenosis.   Vertebrals:  Bilateral vertebral arteries demonstrate antegrade flow.  Subclavians: Normal flow hemodynamics were seen in bilateral subclavian               arteries.   EKG:  EKG ectopic atrial rhythm with normal PR interval and occasional PACs.  Small insignificant Q waves inferior leads.  Recent Labs: No results found for requested labs within last 365 days.  Recent Lipid Panel No results found for: "CHOL", "TRIG", "HDL", "CHOLHDL", "VLDL", "LDLCALC", "LDLDIRECT"  Physical Exam:    VS:  BP (!) 148/68   Pulse 97   Ht 6' (1.829 m)   Wt 187 lb 9.6 oz (85.1 kg)   SpO2 (!) 67%   BMI 25.44 kg/m     Wt Readings from Last 3 Encounters:  07/11/21 187 lb 9.6 oz (85.1 kg)  12/27/15 196 lb (88.9 kg)  12/13/15 196 lb (88.9 kg)     GEN: Compatible with age.  Appears vigorous.  Has lost weight since 2017, down approximately 10 pounds.. No acute distress HEENT: Normal NECK: No JVD.  There are bilateral carotid bruits, high-pitched.  Most recent Doppler study was done less than a year ago.   The auscultatory finding has been present for years. LYMPHATICS: No lymphadenopathy CARDIAC: No murmur. RRR S4 gallop, or edema. VASCULAR:  Normal Pulses. No bruits. RESPIRATORY:  Clear to auscultation without rales, wheezing or rhonchi  ABDOMEN: Soft, non-tender, non-distended, No pulsatile mass, MUSCULOSKELETAL: No deformity  SKIN: Warm and dry NEUROLOGIC:  Alert and oriented x 3 PSYCHIATRIC:  Normal affect  ASSESSMENT:    1. CAD of autologous vein bypass graft without angina   2. Bilateral carotid artery stenosis   3. Essential hypertension   4. Other hyperlipidemia   5. DM type 2, controlled, with complication ([place])    PLAN:    In order of problems listed above:  Secondary prevention reviewed.  Lexiscan myocardial perfusion study to assess CV risk now 22 years post coronary bypass surgery. Bilateral carotid bruits.  Probably needs to be followed yearly.  Most recent carotid Dopplers demonstrate less than 40% obstruction.  See above. For age Target blood pressure 140/80 mmHg.  Slightly higher than that today.  Watch salt in diet. Continue rosuvastatin 20 mg/day.  Most recent LDL was 39 in November. Hemoglobin A1c 6.07 December 2020.   Overall education and awareness concerning primary/secondary risk prevention was discussed in detail: LDL less than 70, hemoglobin A1c less than 7, blood pressure target less than 130/80 mmHg, >150 minutes of moderate aerobic activity per week, avoidance of smoking, weight control (via diet and exercise), and continued surveillance/management of/for obstructive sleep apnea.  Scan Myoview to determine ischemic risk.  Medication Adjustments/Labs and Tests Ordered: Current medicines are reviewed at length with the patient today.  Concerns regarding medicines are outlined above.  Orders Placed This Encounter  Procedures   Cardiac Stress Test: Informed Consent Details: Physician/Practitioner Attestation; Transcribe to consent form and obtain patient  signature   MYOCARDIAL PERFUSION IMAGING   EKG 12-Lead   No orders of the defined types were placed in this encounter.   Patient Instructions  Medication Instructions:  Your physician recommends that you continue on your current medications as directed. Please refer to the Current Medication list given to you today.  *If you need a refill on your cardiac medications before your next appointment, please call your pharmacy*  Lab Work: NONE  Testing/Procedures: Your physician has requested that you have a lexiscan myoview. For further information please visit [email or web address]. Please follow instruction sheet, as given.   Follow-Up: At [facility], you and your health needs are our priority.  As part of our continuing mission to provide you with exceptional heart care, we have created designated Provider Care Teams.  These Care Teams include your primary Cardiologist (physician) and Advanced Practice Providers (APPs -  Physician Assistants and Nurse Practitioners) who all work together to provide you with the care you need, when you need it.  Your next appointment:   1 year(s)  The format for your next appointment:   In Person  Provider:   Sinclair Grooms, MD {   Important Information About Sugar         Signed, Sinclair Grooms, MD  07/11/2021 10:17 AM    [place]

## 2021-07-11 ENCOUNTER — Ambulatory Visit: Payer: Medicare HMO | Admitting: Interventional Cardiology

## 2021-07-11 ENCOUNTER — Encounter: Payer: Self-pay | Admitting: Interventional Cardiology

## 2021-07-11 VITALS — BP 148/68 | HR 97 | Ht 72.0 in | Wt 187.6 lb

## 2021-07-11 DIAGNOSIS — E7849 Other hyperlipidemia: Secondary | ICD-10-CM | POA: Diagnosis not present

## 2021-07-11 DIAGNOSIS — I10 Essential (primary) hypertension: Secondary | ICD-10-CM

## 2021-07-11 DIAGNOSIS — E118 Type 2 diabetes mellitus with unspecified complications: Secondary | ICD-10-CM | POA: Diagnosis not present

## 2021-07-11 DIAGNOSIS — I6523 Occlusion and stenosis of bilateral carotid arteries: Secondary | ICD-10-CM

## 2021-07-11 DIAGNOSIS — I25810 Atherosclerosis of coronary artery bypass graft(s) without angina pectoris: Secondary | ICD-10-CM | POA: Diagnosis not present

## 2021-07-11 NOTE — Patient Instructions (Signed)
Medication Instructions:  Your physician recommends that you continue on your current medications as directed. Please refer to the Current Medication list given to you today.  *If you need a refill on your cardiac medications before your next appointment, please call your pharmacy*  Lab Work: NONE  Testing/Procedures: Your physician has requested that you have a lexiscan myoview. For further information please visit [email or web address]. Please follow instruction sheet, as given.   Follow-Up: At [facility], you and your health needs are our priority.  As part of our continuing mission to provide you with exceptional heart care, we have created designated Provider Care Teams.  These Care Teams include your primary Cardiologist (physician) and Advanced Practice Providers (APPs -  Physician Assistants and Nurse Practitioners) who all work together to provide you with the care you need, when you need it.  Your next appointment:   1 year(s)  The format for your next appointment:   In Person  Provider:   Henry W Smith III, MD {   Important Information About Sugar       

## 2021-07-18 ENCOUNTER — Telehealth (HOSPITAL_COMMUNITY): Payer: Self-pay

## 2021-07-18 NOTE — Telephone Encounter (Signed)
Detailed instructions left on the patient's answering machine. Asked to call back with any questions. S.Sheryl Saintil EMTP 

## 2021-07-21 ENCOUNTER — Ambulatory Visit (HOSPITAL_COMMUNITY): Payer: Medicare HMO | Attending: Internal Medicine

## 2021-07-21 DIAGNOSIS — I25810 Atherosclerosis of coronary artery bypass graft(s) without angina pectoris: Secondary | ICD-10-CM | POA: Insufficient documentation

## 2021-07-21 LAB — MYOCARDIAL PERFUSION IMAGING
LV dias vol: 87 mL (ref 62–150)
LV sys vol: 34 mL
Nuc Stress EF: 61 %
Peak HR: 83 {beats}/min
Rest HR: 62 {beats}/min
Rest Nuclear Isotope Dose: 9.3 mCi
SDS: 0
SRS: 0
SSS: 0
ST Depression (mm): 0 mm
Stress Nuclear Isotope Dose: 32.3 mCi
TID: 0.89

## 2021-07-21 MED ORDER — TECHNETIUM TC 99M TETROFOSMIN IV KIT
32.3000 | PACK | Freq: Once | INTRAVENOUS | Status: AC | PRN
  Administered 2021-07-21: 32.3 via INTRAVENOUS

## 2021-07-21 MED ORDER — TECHNETIUM TC 99M TETROFOSMIN IV KIT
9.3000 | PACK | Freq: Once | INTRAVENOUS | Status: AC | PRN
  Administered 2021-07-21: 9.3 via INTRAVENOUS

## 2021-07-21 MED ORDER — REGADENOSON 0.4 MG/5ML IV SOLN
0.4000 mg | Freq: Once | INTRAVENOUS | Status: AC
  Administered 2021-07-21: 0.4 mg via INTRAVENOUS

## 2021-07-28 ENCOUNTER — Telehealth: Payer: Self-pay | Admitting: Interventional Cardiology

## 2021-07-28 NOTE — Telephone Encounter (Signed)
Returned call to patient to discuss results of stress test from 07/21/21.  Informed patient that Dr. Tamala Julian has not yet had a chance to look over the results. Shared study highlight with patient: Normal perfusion. LVEF 61% with normal wall motion. This is a low risk study. No prior for comparison.  Advised patient that Dr. Tamala Julian will need to review the study and we will call with his comments and any recommendations.  Patient verbalized understanding and expressed appreciation for call.

## 2021-07-28 NOTE — Telephone Encounter (Signed)
Patient is looking for test results from 07/21/21.  He stated he will be going out of town this evening.

## 2021-09-29 DIAGNOSIS — I739 Peripheral vascular disease, unspecified: Secondary | ICD-10-CM | POA: Diagnosis not present

## 2021-09-29 DIAGNOSIS — E1151 Type 2 diabetes mellitus with diabetic peripheral angiopathy without gangrene: Secondary | ICD-10-CM | POA: Diagnosis not present

## 2021-09-29 DIAGNOSIS — I10 Essential (primary) hypertension: Secondary | ICD-10-CM | POA: Diagnosis not present

## 2021-09-29 DIAGNOSIS — Z794 Long term (current) use of insulin: Secondary | ICD-10-CM | POA: Diagnosis not present

## 2021-09-29 DIAGNOSIS — E785 Hyperlipidemia, unspecified: Secondary | ICD-10-CM | POA: Diagnosis not present

## 2021-09-29 DIAGNOSIS — I2510 Atherosclerotic heart disease of native coronary artery without angina pectoris: Secondary | ICD-10-CM | POA: Diagnosis not present

## 2021-09-29 DIAGNOSIS — R0683 Snoring: Secondary | ICD-10-CM | POA: Diagnosis not present

## 2021-10-20 DIAGNOSIS — H35373 Puckering of macula, bilateral: Secondary | ICD-10-CM | POA: Diagnosis not present

## 2021-10-20 DIAGNOSIS — E119 Type 2 diabetes mellitus without complications: Secondary | ICD-10-CM | POA: Diagnosis not present

## 2021-10-20 DIAGNOSIS — H52203 Unspecified astigmatism, bilateral: Secondary | ICD-10-CM | POA: Diagnosis not present

## 2021-10-20 DIAGNOSIS — H02834 Dermatochalasis of left upper eyelid: Secondary | ICD-10-CM | POA: Diagnosis not present

## 2021-10-20 DIAGNOSIS — H02831 Dermatochalasis of right upper eyelid: Secondary | ICD-10-CM | POA: Diagnosis not present

## 2021-11-10 DIAGNOSIS — E161 Other hypoglycemia: Secondary | ICD-10-CM | POA: Diagnosis not present

## 2021-11-10 DIAGNOSIS — E1165 Type 2 diabetes mellitus with hyperglycemia: Secondary | ICD-10-CM | POA: Diagnosis not present

## 2021-11-10 DIAGNOSIS — I10 Essential (primary) hypertension: Secondary | ICD-10-CM | POA: Diagnosis not present

## 2021-11-10 DIAGNOSIS — R61 Generalized hyperhidrosis: Secondary | ICD-10-CM | POA: Diagnosis not present

## 2021-11-10 DIAGNOSIS — R404 Transient alteration of awareness: Secondary | ICD-10-CM | POA: Diagnosis not present

## 2021-11-10 DIAGNOSIS — E162 Hypoglycemia, unspecified: Secondary | ICD-10-CM | POA: Diagnosis not present

## 2021-11-15 DIAGNOSIS — E1151 Type 2 diabetes mellitus with diabetic peripheral angiopathy without gangrene: Secondary | ICD-10-CM | POA: Diagnosis not present

## 2021-11-15 DIAGNOSIS — N1831 Chronic kidney disease, stage 3a: Secondary | ICD-10-CM | POA: Diagnosis not present

## 2021-11-15 DIAGNOSIS — I2510 Atherosclerotic heart disease of native coronary artery without angina pectoris: Secondary | ICD-10-CM | POA: Diagnosis not present

## 2021-11-15 DIAGNOSIS — I1310 Hypertensive heart and chronic kidney disease without heart failure, with stage 1 through stage 4 chronic kidney disease, or unspecified chronic kidney disease: Secondary | ICD-10-CM | POA: Diagnosis not present

## 2021-11-15 DIAGNOSIS — Z794 Long term (current) use of insulin: Secondary | ICD-10-CM | POA: Diagnosis not present

## 2021-12-28 DIAGNOSIS — I2510 Atherosclerotic heart disease of native coronary artery without angina pectoris: Secondary | ICD-10-CM | POA: Diagnosis not present

## 2021-12-28 DIAGNOSIS — I1310 Hypertensive heart and chronic kidney disease without heart failure, with stage 1 through stage 4 chronic kidney disease, or unspecified chronic kidney disease: Secondary | ICD-10-CM | POA: Diagnosis not present

## 2021-12-28 DIAGNOSIS — N1831 Chronic kidney disease, stage 3a: Secondary | ICD-10-CM | POA: Diagnosis not present

## 2021-12-28 DIAGNOSIS — E1151 Type 2 diabetes mellitus with diabetic peripheral angiopathy without gangrene: Secondary | ICD-10-CM | POA: Diagnosis not present

## 2021-12-28 DIAGNOSIS — Z794 Long term (current) use of insulin: Secondary | ICD-10-CM | POA: Diagnosis not present

## 2022-01-27 DIAGNOSIS — E1151 Type 2 diabetes mellitus with diabetic peripheral angiopathy without gangrene: Secondary | ICD-10-CM | POA: Diagnosis not present

## 2022-01-27 DIAGNOSIS — R7989 Other specified abnormal findings of blood chemistry: Secondary | ICD-10-CM | POA: Diagnosis not present

## 2022-01-27 DIAGNOSIS — E785 Hyperlipidemia, unspecified: Secondary | ICD-10-CM | POA: Diagnosis not present

## 2022-01-27 DIAGNOSIS — I10 Essential (primary) hypertension: Secondary | ICD-10-CM | POA: Diagnosis not present

## 2022-02-02 DIAGNOSIS — E785 Hyperlipidemia, unspecified: Secondary | ICD-10-CM | POA: Diagnosis not present

## 2022-02-02 DIAGNOSIS — I129 Hypertensive chronic kidney disease with stage 1 through stage 4 chronic kidney disease, or unspecified chronic kidney disease: Secondary | ICD-10-CM | POA: Diagnosis not present

## 2022-02-02 DIAGNOSIS — Z794 Long term (current) use of insulin: Secondary | ICD-10-CM | POA: Diagnosis not present

## 2022-02-02 DIAGNOSIS — D649 Anemia, unspecified: Secondary | ICD-10-CM | POA: Diagnosis not present

## 2022-02-02 DIAGNOSIS — R82998 Other abnormal findings in urine: Secondary | ICD-10-CM | POA: Diagnosis not present

## 2022-02-02 DIAGNOSIS — Z1331 Encounter for screening for depression: Secondary | ICD-10-CM | POA: Diagnosis not present

## 2022-02-02 DIAGNOSIS — E1151 Type 2 diabetes mellitus with diabetic peripheral angiopathy without gangrene: Secondary | ICD-10-CM | POA: Diagnosis not present

## 2022-02-02 DIAGNOSIS — N1831 Chronic kidney disease, stage 3a: Secondary | ICD-10-CM | POA: Diagnosis not present

## 2022-02-02 DIAGNOSIS — I2510 Atherosclerotic heart disease of native coronary artery without angina pectoris: Secondary | ICD-10-CM | POA: Diagnosis not present

## 2022-02-02 DIAGNOSIS — Z1339 Encounter for screening examination for other mental health and behavioral disorders: Secondary | ICD-10-CM | POA: Diagnosis not present

## 2022-02-02 DIAGNOSIS — I739 Peripheral vascular disease, unspecified: Secondary | ICD-10-CM | POA: Diagnosis not present

## 2022-02-02 DIAGNOSIS — Z0000 Encounter for general adult medical examination without abnormal findings: Secondary | ICD-10-CM | POA: Diagnosis not present

## 2022-02-06 DIAGNOSIS — H6123 Impacted cerumen, bilateral: Secondary | ICD-10-CM | POA: Diagnosis not present

## 2022-02-06 DIAGNOSIS — H9193 Unspecified hearing loss, bilateral: Secondary | ICD-10-CM | POA: Diagnosis not present

## 2022-02-07 DIAGNOSIS — L821 Other seborrheic keratosis: Secondary | ICD-10-CM | POA: Diagnosis not present

## 2022-02-07 DIAGNOSIS — L738 Other specified follicular disorders: Secondary | ICD-10-CM | POA: Diagnosis not present

## 2022-02-07 DIAGNOSIS — L0889 Other specified local infections of the skin and subcutaneous tissue: Secondary | ICD-10-CM | POA: Diagnosis not present

## 2022-02-08 DIAGNOSIS — E1165 Type 2 diabetes mellitus with hyperglycemia: Secondary | ICD-10-CM | POA: Diagnosis not present

## 2022-02-10 ENCOUNTER — Ambulatory Visit
Admission: EM | Admit: 2022-02-10 | Discharge: 2022-02-10 | Disposition: A | Discharge status: Home / Self Care | Payer: Medicare HMO | Attending: Urgent Care | Admitting: Urgent Care

## 2022-02-10 DIAGNOSIS — R030 Elevated blood-pressure reading, without diagnosis of hypertension: Secondary | ICD-10-CM | POA: Diagnosis not present

## 2022-02-10 DIAGNOSIS — U071 COVID-19: Secondary | ICD-10-CM | POA: Diagnosis not present

## 2022-02-10 DIAGNOSIS — Z79899 Other long term (current) drug therapy: Secondary | ICD-10-CM | POA: Diagnosis not present

## 2022-02-10 DIAGNOSIS — I10 Essential (primary) hypertension: Secondary | ICD-10-CM | POA: Insufficient documentation

## 2022-02-10 DIAGNOSIS — B349 Viral infection, unspecified: Secondary | ICD-10-CM | POA: Diagnosis not present

## 2022-02-10 MED ORDER — PROMETHAZINE-DM 6.25-15 MG/5ML PO SYRP: 2.5000 mL | ORAL_SOLUTION | Freq: Three times a day (TID) | ORAL | 0 refills | Status: AC | PRN

## 2022-02-10 MED ORDER — ACETAMINOPHEN 325 MG PO TABS: 650.0000 mg | ORAL_TABLET | Freq: Four times a day (QID) | ORAL | 0 refills | Status: AC | PRN

## 2022-02-10 MED ORDER — IPRATROPIUM BROMIDE 0.03 % NA SOLN: 2.0000 | Freq: Two times a day (BID) | NASAL | 0 refills | Status: AC

## 2022-02-10 MED ORDER — CETIRIZINE HCL 1 MG/ML PO SOLN: 5.0000 mg | Freq: Every day | ORAL | 0 refills | Status: AC

## 2022-02-10 NOTE — ED Triage Notes (Signed)
[facility] to [facility] with [facility], pt reports runny nose, congestion and sore throat since yesterday. Pt reports no otc medications concerned for covid.

## 2022-02-10 NOTE — ED Provider Notes (Signed)
[facility]  Note:  This document was prepared using Systems analyst and may include unintentional dictation errors.  MRN: [id number] DOB: 1940-10-06  Subjective:   Leonard Seal Sr. is a 82 y.o. male presenting for 1 day history of acute onset persistent runny and stuffy nose, throat pain.  Has had a slight cough but has been very minimal.  No headache, confusion, weakness, numbness or tingling, chest pain, shortness of breath, heart racing.  Patient would like a COVID test.  He does not want blood work to confirm his GFR.  States that he has never had CKD.  He does want to take Paxlovid if he test positive for COVID-19.  Patient is no longer smoker.  Takes insulin for his type 2 diabetes and is well-controlled.  Checked his blood sugar for [place] in the clinic and was at 107.   Current Facility-Administered Medications:    0.9 %  sodium chloride infusion, 500 mL, Intravenous, Continuous, Gatha Mayer, MD  Current Outpatient Medications:    amLODipine (NORVASC) 5 MG tablet, Take 5 mg by mouth daily., Disp: , Rfl:    aspirin 81 MG tablet, Take 81 mg by mouth daily., Disp: , Rfl:    Ca Carbonate-Mag Hydroxide (ROLAIDS PO), Take 1-2 tablets by mouth as needed (indigestion)., Disp: , Rfl:    carvedilol (COREG) 12.5 MG tablet, Take 12.5 mg by mouth 2 (two) times daily., Disp: , Rfl:    insulin glargine (LANTUS) 100 UNIT/ML injection, Inject 15 Units into the skin daily. 15 units in morning and 5 units at night, Disp: , Rfl:    insulin lispro (HUMALOG) 100 UNIT/ML injection, Inject 10 Units into the skin 2 (two) times daily with a meal. , Disp: , Rfl:    losartan-hydrochlorothiazide (HYZAAR) 100-12.5 MG per tablet, Take 1 tablet by mouth daily., Disp: , Rfl:    metFORMIN (GLUCOPHAGE) 500 MG tablet, Take by mouth 2 (two) times daily with a meal., Disp: , Rfl:    NITROSTAT 0.4 MG SL tablet, Place 0.4 mg under the tongue every 5 (five) minutes as needed  for chest pain. , Disp: , Rfl:    rosuvastatin (CRESTOR) 20 MG tablet, Take 20 mg by mouth at bedtime., Disp: , Rfl:    vitamin B-12 (CYANOCOBALAMIN) 500 MCG tablet, Take 500 mcg by mouth daily., Disp: , Rfl:    Allergies  Allergen Reactions   Penicillins Other (See Comments)    Childhood allergy: reaction unknown     Past Medical History:  Diagnosis Date   Allergy    Angiodysplasia of cecum 12/27/2015   Bilateral carotid bruits    Coronary atherosclerosis    Diabetes mellitus without complication (HCC)    Diverticulosis    Dysplastic nevus    Hyperlipidemia    Hypertension    Migraine headache with aura    Myocardial infarction ([place]) 2002   Skin mole    Vitamin B12 deficiency      Past Surgical History:  Procedure Laterality Date   CATARACT EXTRACTION     COLONOSCOPY  07/13/2005, 12/27/15   gessner- tics only    CORONARY ARTERY BYPASS GRAFT     cabg x 5     Family History  Problem Relation Age of Onset   Diabetes Mother    Cancer Father    Stomach cancer Father    Anorexia nervosa Sister    Diabetes Brother    Diabetes Brother    Heart disease Brother  Healthy Brother    Healthy Brother    Other Brother        POOR CIRCULATION   Colon cancer Neg Hx    Esophageal cancer Neg Hx    Rectal cancer Neg Hx    Pancreatic cancer Neg Hx    Prostate cancer Neg Hx     Social History   Tobacco Use   Smoking status: Every Day    Types: Cigars   Smokeless tobacco: Never   Tobacco comments:    occ cigars  Substance Use Topics   Alcohol use: Yes    Comment: occasionally    Drug use: No    ROS   Objective:   Vitals: BP (!) 181/71   Pulse 69   Temp (!) 97.3 F (36.3 C)   Resp 19   SpO2 98%   Physical Exam Constitutional:      General: He is not in acute distress.    Appearance: Normal appearance. He is well-developed. He is not ill-appearing, toxic-appearing or diaphoretic.  HENT:     Head: Normocephalic and atraumatic.     Right Ear: External  ear normal.     Left Ear: External ear normal.     Nose: Congestion present. No rhinorrhea.     Mouth/Throat:     Mouth: Mucous membranes are moist.     Pharynx: No oropharyngeal exudate or posterior oropharyngeal erythema.  Eyes:     General: No scleral icterus.       Right eye: No discharge.        Left eye: No discharge.     Extraocular Movements: Extraocular movements intact.  Cardiovascular:     Rate and Rhythm: Normal rate and regular rhythm.     Heart sounds: Normal heart sounds. No murmur heard.    No friction rub. No gallop.  Pulmonary:     Effort: Pulmonary effort is normal. No respiratory distress.     Breath sounds: Normal breath sounds. No stridor. No wheezing, rhonchi or rales.  Neurological:     Mental Status: He is alert and oriented to person, place, and time.     Cranial Nerves: No cranial nerve deficit.     Motor: No weakness.     Gait: Gait normal.     Comments: Negative Romberg and pronator drift.  No facial asymmetry.  Psychiatric:        Mood and Affect: Mood normal.        Behavior: Behavior normal.        Thought Content: Thought content normal.     Assessment and Plan :   PDMP not reviewed this encounter.  1. Acute viral syndrome   2. Essential hypertension   3. Elevated blood pressure reading     Deferred imaging given clear cardiopulmonary exam, hemodynamically stable vital signs.  Patient has not yet taken his evening dose of his blood pressure medication.  I do not see any signs of an acute encephalopathy.  Recommended compliance with his medications.  Will manage for viral illness such as viral URI, viral syndrome, viral rhinitis, COVID-19. Recommended supportive care. Offered scripts for symptomatic relief. Testing is pending. Counseled patient on potential for adverse effects with medications prescribed/recommended today, ER and return-to-clinic precautions discussed, patient verbalized understanding.   If patient test positive for COVID-19,  I was agreeable to prescribing Paxlovid despite that he did not want to have blood work for his GFR.  He would have to hold his Crestor if this is the case.  Jaynee Eagles, [place] 02/10/22 [id number]

## 2022-02-10 NOTE — Discharge Instructions (Signed)
We will notify you of your test results as they arrive and may take between about 24 hours.  I encourage you to sign up for MyChart if you have not already done so as this can be the easiest way for [place] to communicate results to you online or through a phone app.  Generally, we only contact you if it is a positive test result.  In the meantime, if you develop worsening symptoms including fever, chest pain, shortness of breath despite our current treatment plan then please report to the emergency room as this may be a sign of worsening status from possible viral infection.  Otherwise, we will manage this as a viral syndrome. For sore throat or cough try using a honey-based tea. Use 3 teaspoons of honey with juice squeezed from half lemon. Place shaved pieces of ginger into 1/2-1 cup of water and warm over stove top. Then mix the ingredients and repeat every 4 hours as needed. Please take Tylenol '500mg'$ -'650mg'$  every 6 hours for aches and pains, fevers. Hydrate very well with at least 2 liters of water. Eat light meals such as soups to replenish electrolytes and soft fruits, veggies. Start an antihistamine like Zyrtec for postnasal drainage, sinus congestion.  Use the cough medications as needed.

## 2022-02-11 LAB — SARS CORONAVIRUS 2 (TAT 6-24 HRS): SARS Coronavirus 2: POSITIVE — AB

## 2022-03-16 DIAGNOSIS — I1310 Hypertensive heart and chronic kidney disease without heart failure, with stage 1 through stage 4 chronic kidney disease, or unspecified chronic kidney disease: Secondary | ICD-10-CM | POA: Diagnosis not present

## 2022-03-16 DIAGNOSIS — N1831 Chronic kidney disease, stage 3a: Secondary | ICD-10-CM | POA: Diagnosis not present

## 2022-03-16 DIAGNOSIS — E1151 Type 2 diabetes mellitus with diabetic peripheral angiopathy without gangrene: Secondary | ICD-10-CM | POA: Diagnosis not present

## 2022-03-16 DIAGNOSIS — I2510 Atherosclerotic heart disease of native coronary artery without angina pectoris: Secondary | ICD-10-CM | POA: Diagnosis not present

## 2022-03-16 DIAGNOSIS — Z794 Long term (current) use of insulin: Secondary | ICD-10-CM | POA: Diagnosis not present

## 2022-05-09 DIAGNOSIS — E1165 Type 2 diabetes mellitus with hyperglycemia: Secondary | ICD-10-CM | POA: Diagnosis not present

## 2022-06-15 DIAGNOSIS — Z794 Long term (current) use of insulin: Secondary | ICD-10-CM | POA: Diagnosis not present

## 2022-06-15 DIAGNOSIS — N1831 Chronic kidney disease, stage 3a: Secondary | ICD-10-CM | POA: Diagnosis not present

## 2022-06-15 DIAGNOSIS — I2510 Atherosclerotic heart disease of native coronary artery without angina pectoris: Secondary | ICD-10-CM | POA: Diagnosis not present

## 2022-06-15 DIAGNOSIS — E1151 Type 2 diabetes mellitus with diabetic peripheral angiopathy without gangrene: Secondary | ICD-10-CM | POA: Diagnosis not present

## 2022-06-15 DIAGNOSIS — I1310 Hypertensive heart and chronic kidney disease without heart failure, with stage 1 through stage 4 chronic kidney disease, or unspecified chronic kidney disease: Secondary | ICD-10-CM | POA: Diagnosis not present

## 2022-07-31 DIAGNOSIS — I2510 Atherosclerotic heart disease of native coronary artery without angina pectoris: Secondary | ICD-10-CM | POA: Diagnosis not present

## 2022-07-31 DIAGNOSIS — I10 Essential (primary) hypertension: Secondary | ICD-10-CM | POA: Diagnosis not present

## 2022-07-31 DIAGNOSIS — I739 Peripheral vascular disease, unspecified: Secondary | ICD-10-CM | POA: Diagnosis not present

## 2022-07-31 DIAGNOSIS — E1151 Type 2 diabetes mellitus with diabetic peripheral angiopathy without gangrene: Secondary | ICD-10-CM | POA: Diagnosis not present

## 2022-07-31 DIAGNOSIS — R809 Proteinuria, unspecified: Secondary | ICD-10-CM | POA: Diagnosis not present

## 2022-07-31 NOTE — Progress Notes (Signed)
Cardiology Office Note:   Date:  08/09/2022  NAME:  Leonard KOHLMEYER Sr.    MRN: [id number] DOB:  08/23/1940   PCP:  Garlan Fillers, MD  Cardiologist:  Lesleigh Noe, MD (Inactive)  Electrophysiologist:  None   Referring MD: Garlan Fillers, MD   Chief Complaint  Patient presents with   Follow-up         History of Present Illness:   Leonard YERBY Sr. is a 82 y.o. male with a hx of CAD s/p CABG, DM, HTN, HLD who presents for follow-up.  He tells me he is having no chest discomfort or trouble breathing.  Had bypass surgery in 2000.  Stress test last summer was normal.  Reports he can get a little winded with activity but he suspects this is due to the fact that he is completely inactive.  I encouraged him to exercise more.  Blood pressure slightly elevated but for his age it is likely okay. Lipids at goal. DM at goal. R carotid bruit noted.  No significant disease noted on ultrasound from 2022.  We discussed repeating this today.  Lipids are at goal.  CV exam unremarkable.  Seems to be stable and doing well.   Problem List CAD -CABG 12/1998 -normal MPI 06/2021 2. Carotid artery disease -1-39% bilaterally  3. HTN 4. HLD -T chol 118, HDL 56, LDL 39, TG 116 5. DM -A1c 6.8  Past Medical History: Past Medical History:  Diagnosis Date   Allergy    Angiodysplasia of cecum 12/27/2015   Bilateral carotid bruits    Coronary atherosclerosis    Diabetes mellitus without complication (HCC)    Diverticulosis    Dysplastic nevus    Hyperlipidemia    Hypertension    Migraine headache with aura    Myocardial infarction (HCC) 2002   Skin mole    Vitamin B12 deficiency     Past Surgical History: Past Surgical History:  Procedure Laterality Date   CATARACT EXTRACTION     COLONOSCOPY  07/13/2005, 12/27/15   gessner- tics only    CORONARY ARTERY BYPASS GRAFT     cabg x 5     Current Medications: Current Meds  Medication Sig   amLODipine (NORVASC) 5 MG tablet Take  5 mg by mouth daily.   aspirin 81 MG tablet Take 81 mg by mouth daily.   Ca Carbonate-Mag Hydroxide (ROLAIDS PO) Take 1-2 tablets by mouth as needed (indigestion).   carvedilol (COREG) 12.5 MG tablet Take 12.5 mg by mouth 2 (two) times daily.   insulin glargine (LANTUS) 100 UNIT/ML injection Inject 15 Units into the skin daily. 15 units in morning and 5 units at night   insulin lispro (HUMALOG) 100 UNIT/ML injection Inject 10 Units into the skin 2 (two) times daily with a meal.    losartan-hydrochlorothiazide (HYZAAR) 100-12.5 MG per tablet Take 1 tablet by mouth daily.   metFORMIN (GLUCOPHAGE) 500 MG tablet Take by mouth 2 (two) times daily with a meal.   NITROSTAT 0.4 MG SL tablet Place 0.4 mg under the tongue every 5 (five) minutes as needed for chest pain.    rosuvastatin (CRESTOR) 20 MG tablet Take 20 mg by mouth at bedtime.   vitamin B-12 (CYANOCOBALAMIN) 500 MCG tablet Take 500 mcg by mouth daily.   Current Facility-Administered Medications for the 08/09/22 encounter (Office Visit) with Sande Rives, MD  Medication   0.9 %  sodium chloride infusion     Allergies:  Penicillins   Social History: Social History   Socioeconomic History   Marital status: Married    Spouse name: Not on file   Number of children: 2   Years of education: Not on file   Highest education level: Not on file  Occupational History   Occupation: Retired Psychologist, occupational  Tobacco Use   Smoking status: Every Day    Types: Cigars   Smokeless tobacco: Never   Tobacco comments:    occ cigars  Substance and Sexual Activity   Alcohol use: Yes    Comment: occasionally    Drug use: No   Sexual activity: Not on file  Other Topics Concern   Not on file  Social History Narrative   Not on file   Social Determinants of Health   Financial Resource Strain: Not on file  Food Insecurity: Not on file  Transportation Needs: Not on file  Physical Activity: Not on file  Stress: Not on file   Social Connections: Not on file     Family History: The patient's family history includes Anorexia nervosa in his sister; Cancer in his father; Diabetes in his brother, brother, and mother; Healthy in his brother and brother; Heart disease in his brother; Other in his brother; Stomach cancer in his father. There is no history of Colon cancer, Esophageal cancer, Rectal cancer, Pancreatic cancer, or Prostate cancer.  ROS:   All other ROS reviewed and negative. Pertinent positives noted in the HPI.     EKGs/Labs/Other Studies Reviewed:   The following studies were personally reviewed by me today:  EKG:  EKG is ordered today.    EKG Interpretation Date/Time:  Wednesday August 09 2022 09:28:30 EDT Ventricular Rate:  64 PR Interval:  122 QRS Duration:  84 QT Interval:  418 QTC Calculation: 431 R Axis:   73  Text Interpretation: Sinus rhythm with marked sinus arrhythmia Normal ECG Confirmed by Lennie Odor [phone]) on 08/09/2022 9:33:11 AM   Recent Labs: No results found for requested labs within last 365 days.   Recent Lipid Panel No results found for: "CHOL", "TRIG", "HDL", "CHOLHDL", "VLDL", "LDLCALC", "LDLDIRECT"  Physical Exam:   VS:  BP (!) 150/54   Pulse 97   Ht 6' (1.829 m)   Wt 185 lb 6.4 oz (84.1 kg)   SpO2 97%   BMI 25.14 kg/m    Wt Readings from Last 3 Encounters:  08/09/22 185 lb 6.4 oz (84.1 kg)  07/21/21 187 lb (84.8 kg)  07/11/21 187 lb 9.6 oz (85.1 kg)    General: Well nourished, well developed, in no acute distress Head: Atraumatic, normal size  Eyes: PEERLA, EOMI  Neck: Right carotid bruit noted Endocrine: No thryomegaly Cardiac: Normal S1, S2; RRR; no murmurs, rubs, or gallops Lungs: Clear to auscultation bilaterally, no wheezing, rhonchi or rales  Abd: Soft, nontender, no hepatomegaly  Ext: No edema, pulses 2+ Musculoskeletal: No deformities, BUE and BLE strength normal and equal Skin: Warm and dry, no rashes   Neuro: Alert and oriented to person,  place, time, and situation, CNII-XII grossly intact, no focal deficits  Psych: Normal mood and affect   ASSESSMENT:   Leonard CORTES Sr. is a 82 y.o. male who presents for the following: 1. Coronary artery disease involving coronary bypass graft of native heart without angina pectoris   2. Mixed hyperlipidemia   3. Renovascular hypertension   4. Carotid artery disease, unspecified laterality, unspecified type (HCC)     PLAN:   1. Coronary artery  disease involving coronary bypass graft of native heart without angina pectoris 2. Mixed hyperlipidemia -Status post CABG in 2000.  Stress test last year normal.  He will continue aspirin 81 mg daily and Crestor 20.  LDL cholesterol 39 which is at goal.  No symptoms of angina today.  3. Renovascular hypertension -BP a bit elevated but ranges between 1 40-1 50 at home.  Given his age this is likely okay.  4. Carotid artery disease, unspecified laterality, unspecified type (HCC) -Noticeable right carotid bruit.  No significant disease in 2022.  Repeat ultrasounds.      Disposition: Return in about 1 year (around 08/09/2023).  Medication Adjustments/Labs and Tests Ordered: Current medicines are reviewed at length with the patient today.  Concerns regarding medicines are outlined above.  Orders Placed This Encounter  Procedures   EKG 12-Lead   VAS US CAROTID   No orders of the defined types were placed in this encounter.  Patient Instructions  Medication Instructions:  NO CHANGES  *If you need a refill on your cardiac medications before your next appointment, please call your pharmacy*   Testing/Procedures: Your physician has requested that you have a carotid duplex. This test is an ultrasound of the carotid arteries in your neck. It looks at blood flow through these arteries that supply the brain with blood. Allow one hour for this exam. There are no restrictions or special instructions. This is done at Dr. Marylene Buerger  office   Follow-Up: At [facility], you and your health needs are our priority.  As part of our continuing mission to provide you with exceptional heart care, we have created designated Provider Care Teams.  These Care Teams include your primary Cardiologist (physician) and Advanced Practice Providers (APPs -  Physician Assistants and Nurse Practitioners) who all work together to provide you with the care you need, when you need it.  We recommend signing up for the patient portal called "MyChart".  Sign up information is provided on this After Visit Summary.  MyChart is used to connect with patients for Virtual Visits (Telemedicine).  Patients are able to view lab/test results, encounter notes, upcoming appointments, etc.  Non-urgent messages can be sent to your provider as well.   To learn more about what you can do with MyChart, go to [email or web address].    Your next appointment:    12 months with Dr. Flora Lipps    Time Spent with Patient: I have spent a total of 35 minutes with patient reviewing hospital notes, telemetry, EKGs, labs and examining the patient as well as establishing an assessment and plan that was discussed with the patient.  > 50% of time was spent in direct patient care.  Signed, Lenna Gilford. Flora Lipps, MD, [facility]  [facility]  [address], [address] [place], [place] [address] [phone]  08/09/2022 9:58 AM

## 2022-08-07 DIAGNOSIS — E1165 Type 2 diabetes mellitus with hyperglycemia: Secondary | ICD-10-CM | POA: Diagnosis not present

## 2022-08-09 ENCOUNTER — Encounter: Payer: Self-pay | Admitting: Cardiovascular Disease

## 2022-08-09 ENCOUNTER — Ambulatory Visit: Payer: Medicare HMO | Attending: Cardiovascular Disease | Admitting: Cardiovascular Disease

## 2022-08-09 VITALS — BP 150/54 | HR 97 | Ht 72.0 in | Wt 185.4 lb

## 2022-08-09 DIAGNOSIS — I150 Renovascular hypertension: Secondary | ICD-10-CM

## 2022-08-09 DIAGNOSIS — E782 Mixed hyperlipidemia: Secondary | ICD-10-CM | POA: Diagnosis not present

## 2022-08-09 DIAGNOSIS — I779 Disorder of arteries and arterioles, unspecified: Secondary | ICD-10-CM

## 2022-08-09 DIAGNOSIS — I25810 Atherosclerosis of coronary artery bypass graft(s) without angina pectoris: Secondary | ICD-10-CM | POA: Diagnosis not present

## 2022-08-09 NOTE — Patient Instructions (Signed)
Medication Instructions:  NO CHANGES  *If you need a refill on your cardiac medications before your next appointment, please call your pharmacy*   Testing/Procedures: Your physician has requested that you have a carotid duplex. This test is an ultrasound of the carotid arteries in your neck. It looks at blood flow through these arteries that supply the brain with blood. Allow one hour for this exam. There are no restrictions or special instructions. This is done at Dr. Marylene Buerger office   Follow-Up: At [facility], you and your health needs are our priority.  As part of our continuing mission to provide you with exceptional heart care, we have created designated Provider Care Teams.  These Care Teams include your primary Cardiologist (physician) and Advanced Practice Providers (APPs -  Physician Assistants and Nurse Practitioners) who all work together to provide you with the care you need, when you need it.  We recommend signing up for the patient portal called "MyChart".  Sign up information is provided on this After Visit Summary.  MyChart is used to connect with patients for Virtual Visits (Telemedicine).  Patients are able to view lab/test results, encounter notes, upcoming appointments, etc.  Non-urgent messages can be sent to your provider as well.   To learn more about what you can do with MyChart, go to [email or web address].    Your next appointment:    12 months with Dr. Flora Lipps

## 2022-08-15 ENCOUNTER — Ambulatory Visit (HOSPITAL_COMMUNITY)
Admission: RE | Admit: 2022-08-15 | Discharge: 2022-08-15 | Disposition: A | Discharge status: Home / Self Care | Payer: Medicare HMO | Source: Ambulatory Visit | Attending: Cardiology | Admitting: Cardiology

## 2022-08-15 DIAGNOSIS — I6523 Occlusion and stenosis of bilateral carotid arteries: Secondary | ICD-10-CM | POA: Diagnosis not present

## 2022-08-15 DIAGNOSIS — I779 Disorder of arteries and arterioles, unspecified: Secondary | ICD-10-CM | POA: Insufficient documentation

## 2022-10-24 DIAGNOSIS — E119 Type 2 diabetes mellitus without complications: Secondary | ICD-10-CM | POA: Diagnosis not present

## 2022-10-24 DIAGNOSIS — H5203 Hypermetropia, bilateral: Secondary | ICD-10-CM | POA: Diagnosis not present

## 2022-10-24 DIAGNOSIS — H35373 Puckering of macula, bilateral: Secondary | ICD-10-CM | POA: Diagnosis not present

## 2022-11-05 DIAGNOSIS — E1165 Type 2 diabetes mellitus with hyperglycemia: Secondary | ICD-10-CM | POA: Diagnosis not present

## 2022-11-07 DIAGNOSIS — I1310 Hypertensive heart and chronic kidney disease without heart failure, with stage 1 through stage 4 chronic kidney disease, or unspecified chronic kidney disease: Secondary | ICD-10-CM | POA: Diagnosis not present

## 2022-11-07 DIAGNOSIS — I2510 Atherosclerotic heart disease of native coronary artery without angina pectoris: Secondary | ICD-10-CM | POA: Diagnosis not present

## 2022-11-07 DIAGNOSIS — N1831 Chronic kidney disease, stage 3a: Secondary | ICD-10-CM | POA: Diagnosis not present

## 2022-11-07 DIAGNOSIS — Z794 Long term (current) use of insulin: Secondary | ICD-10-CM | POA: Diagnosis not present

## 2022-11-07 DIAGNOSIS — E1151 Type 2 diabetes mellitus with diabetic peripheral angiopathy without gangrene: Secondary | ICD-10-CM | POA: Diagnosis not present

## 2023-02-06 ENCOUNTER — Other Ambulatory Visit (HOSPITAL_COMMUNITY): Payer: Self-pay

## 2023-02-06 MED ORDER — LANTUS SOLOSTAR 100 UNIT/ML ~~LOC~~ SOPN
12.0000 [IU] | PEN_INJECTOR | Freq: Every day | SUBCUTANEOUS | 3 refills | Status: AC
  Filled 2023-02-06: qty 12, 100d supply, fill #0
  Filled 2023-04-30: qty 12, 100d supply, fill #1
  Filled 2023-08-29: qty 12, 100d supply, fill #2
  Filled 2024-01-08: qty 12, 100d supply, fill #3

## 2023-02-20 LAB — LAB REPORT - SCANNED
A1c: 7.3
EGFR: 38.7

## 2023-02-27 ENCOUNTER — Encounter: Payer: Self-pay | Admitting: Internal Medicine

## 2023-03-19 ENCOUNTER — Other Ambulatory Visit (HOSPITAL_COMMUNITY): Payer: Self-pay

## 2023-03-19 MED ORDER — AMLODIPINE BESYLATE 5 MG PO TABS
5.0000 mg | ORAL_TABLET | Freq: Every day | ORAL | 3 refills | Status: AC
  Filled 2023-03-19: qty 90, 90d supply, fill #0
  Filled 2023-11-08: qty 90, 90d supply, fill #1
  Filled 2024-03-05: qty 90, 90d supply, fill #2

## 2023-03-19 MED ORDER — LOSARTAN POTASSIUM-HCTZ 100-12.5 MG PO TABS
1.0000 | ORAL_TABLET | Freq: Every day | ORAL | 3 refills | Status: DC
  Filled 2023-03-19: qty 90, 90d supply, fill #0
  Filled 2023-07-10: qty 90, 90d supply, fill #1
  Filled 2023-10-08: qty 90, 90d supply, fill #2
  Filled 2024-01-03: qty 90, 90d supply, fill #3

## 2023-03-19 MED ORDER — NOVOLOG FLEXPEN 100 UNIT/ML ~~LOC~~ SOPN
4.0000 [IU] | PEN_INJECTOR | Freq: Three times a day (TID) | SUBCUTANEOUS | 3 refills | Status: AC
  Filled 2023-03-19: qty 21, 88d supply, fill #0
  Filled 2024-01-08: qty 21, 88d supply, fill #1

## 2023-04-30 ENCOUNTER — Other Ambulatory Visit (HOSPITAL_COMMUNITY): Payer: Self-pay

## 2023-04-30 MED ORDER — INSULIN PEN NEEDLE 31G X 5 MM MISC
Freq: Four times a day (QID) | 3 refills | Status: AC
  Filled 2023-04-30 (×2): qty 400, 100d supply, fill #0

## 2023-04-30 MED ORDER — FREESTYLE LIBRE 3 SENSOR MISC
5 refills | Status: AC
  Filled 2023-04-30: qty 6, 84d supply, fill #0
  Filled 2023-07-19: qty 6, 84d supply, fill #1

## 2023-05-04 ENCOUNTER — Other Ambulatory Visit (HOSPITAL_COMMUNITY): Payer: Self-pay

## 2023-05-04 ENCOUNTER — Other Ambulatory Visit (HOSPITAL_BASED_OUTPATIENT_CLINIC_OR_DEPARTMENT_OTHER): Payer: Self-pay

## 2023-05-04 MED ORDER — DOXYCYCLINE HYCLATE 100 MG PO TABS
100.0000 mg | ORAL_TABLET | Freq: Two times a day (BID) | ORAL | 0 refills | Status: AC
  Filled 2023-05-04 (×2): qty 20, 10d supply, fill #0

## 2023-07-12 ENCOUNTER — Other Ambulatory Visit (HOSPITAL_COMMUNITY): Payer: Self-pay

## 2023-07-13 ENCOUNTER — Other Ambulatory Visit (HOSPITAL_COMMUNITY): Payer: Self-pay

## 2023-07-20 ENCOUNTER — Other Ambulatory Visit (HOSPITAL_COMMUNITY): Payer: Self-pay

## 2023-08-28 ENCOUNTER — Other Ambulatory Visit (HOSPITAL_COMMUNITY): Payer: Self-pay

## 2023-08-28 MED ORDER — SYSTANE NIGHT 0.3 % OP GEL
OPHTHALMIC | 11 refills | Status: AC
Start: 2023-08-28 — End: ?
  Filled 2023-08-28: qty 10, 30d supply, fill #0

## 2023-08-28 MED ORDER — IPRATROPIUM BROMIDE 0.06 % NA SOLN
2.0000 | Freq: Four times a day (QID) | NASAL | 3 refills | Status: AC
  Filled 2023-08-28: qty 15, 19d supply, fill #0

## 2023-08-28 MED ORDER — ROSUVASTATIN CALCIUM 20 MG PO TABS
20.0000 mg | ORAL_TABLET | Freq: Every day | ORAL | 3 refills | Status: AC
  Filled 2023-08-28: qty 90, 90d supply, fill #0
  Filled 2023-11-08: qty 90, 90d supply, fill #1
  Filled 2024-03-05: qty 90, 90d supply, fill #2

## 2023-08-28 MED ORDER — METFORMIN HCL 500 MG PO TABS
1000.0000 mg | ORAL_TABLET | Freq: Two times a day (BID) | ORAL | 3 refills | Status: AC
  Filled 2023-08-28: qty 360, 90d supply, fill #0
  Filled 2023-11-08: qty 360, 90d supply, fill #1
  Filled 2024-03-05: qty 360, 90d supply, fill #2

## 2023-08-28 MED ORDER — CARVEDILOL 12.5 MG PO TABS
12.5000 mg | ORAL_TABLET | Freq: Two times a day (BID) | ORAL | 3 refills | Status: DC
  Filled 2023-08-28: qty 90, 90d supply, fill #0
  Filled 2023-11-08: qty 90, 90d supply, fill #1

## 2023-08-29 ENCOUNTER — Other Ambulatory Visit (HOSPITAL_COMMUNITY): Payer: Self-pay

## 2023-08-29 ENCOUNTER — Other Ambulatory Visit (HOSPITAL_BASED_OUTPATIENT_CLINIC_OR_DEPARTMENT_OTHER): Payer: Self-pay

## 2023-09-27 ENCOUNTER — Other Ambulatory Visit (HOSPITAL_COMMUNITY): Payer: Self-pay

## 2023-09-27 ENCOUNTER — Other Ambulatory Visit: Payer: Self-pay

## 2023-09-27 MED ORDER — FREESTYLE LIBRE 3 PLUS SENSOR MISC
11 refills | Status: AC
  Filled 2023-09-27: qty 2, 30d supply, fill #0
  Filled 2023-11-26: qty 2, 30d supply, fill #1
  Filled 2023-12-13: qty 2, 30d supply, fill #2

## 2023-09-28 ENCOUNTER — Other Ambulatory Visit (HOSPITAL_COMMUNITY): Payer: Self-pay

## 2023-10-08 ENCOUNTER — Other Ambulatory Visit (HOSPITAL_COMMUNITY): Payer: Self-pay

## 2023-10-09 ENCOUNTER — Other Ambulatory Visit (HOSPITAL_COMMUNITY): Payer: Self-pay

## 2023-10-15 ENCOUNTER — Other Ambulatory Visit (HOSPITAL_COMMUNITY): Payer: Self-pay

## 2023-11-08 ENCOUNTER — Other Ambulatory Visit (HOSPITAL_COMMUNITY): Payer: Self-pay

## 2023-11-26 ENCOUNTER — Other Ambulatory Visit (HOSPITAL_COMMUNITY): Payer: Self-pay

## 2023-12-13 ENCOUNTER — Other Ambulatory Visit (HOSPITAL_COMMUNITY): Payer: Self-pay

## 2023-12-20 ENCOUNTER — Other Ambulatory Visit (HOSPITAL_COMMUNITY): Payer: Self-pay

## 2023-12-20 DIAGNOSIS — Z794 Long term (current) use of insulin: Secondary | ICD-10-CM | POA: Diagnosis not present

## 2023-12-20 DIAGNOSIS — E1151 Type 2 diabetes mellitus with diabetic peripheral angiopathy without gangrene: Secondary | ICD-10-CM | POA: Diagnosis not present

## 2023-12-20 DIAGNOSIS — N1831 Chronic kidney disease, stage 3a: Secondary | ICD-10-CM | POA: Diagnosis not present

## 2023-12-20 DIAGNOSIS — I1310 Hypertensive heart and chronic kidney disease without heart failure, with stage 1 through stage 4 chronic kidney disease, or unspecified chronic kidney disease: Secondary | ICD-10-CM | POA: Diagnosis not present

## 2023-12-20 DIAGNOSIS — I2510 Atherosclerotic heart disease of native coronary artery without angina pectoris: Secondary | ICD-10-CM | POA: Diagnosis not present

## 2023-12-20 MED ORDER — FREESTYLE LIBRE 3 PLUS SENSOR MISC
3 refills | Status: AC
  Filled 2023-12-20: qty 6, 90d supply, fill #0

## 2023-12-20 MED ORDER — CARVEDILOL 12.5 MG PO TABS
12.5000 mg | ORAL_TABLET | Freq: Two times a day (BID) | ORAL | 3 refills | Status: AC
  Filled 2023-12-20 – 2023-12-21 (×2): qty 180, 90d supply, fill #0
  Filled 2024-03-05: qty 180, 90d supply, fill #1

## 2023-12-21 ENCOUNTER — Other Ambulatory Visit (HOSPITAL_COMMUNITY): Payer: Self-pay

## 2023-12-24 ENCOUNTER — Other Ambulatory Visit (HOSPITAL_COMMUNITY): Payer: Self-pay

## 2023-12-28 ENCOUNTER — Other Ambulatory Visit (HOSPITAL_COMMUNITY): Payer: Self-pay

## 2024-01-01 ENCOUNTER — Other Ambulatory Visit (HOSPITAL_COMMUNITY): Payer: Self-pay

## 2024-01-03 ENCOUNTER — Other Ambulatory Visit (HOSPITAL_COMMUNITY): Payer: Self-pay

## 2024-01-05 ENCOUNTER — Other Ambulatory Visit (HOSPITAL_COMMUNITY): Payer: Self-pay

## 2024-01-08 ENCOUNTER — Other Ambulatory Visit (HOSPITAL_COMMUNITY): Payer: Self-pay

## 2024-01-09 ENCOUNTER — Other Ambulatory Visit (HOSPITAL_COMMUNITY): Payer: Self-pay

## 2024-01-10 ENCOUNTER — Other Ambulatory Visit (HOSPITAL_COMMUNITY): Payer: Self-pay

## 2024-01-17 ENCOUNTER — Other Ambulatory Visit (HOSPITAL_COMMUNITY): Payer: Self-pay

## 2024-01-18 ENCOUNTER — Other Ambulatory Visit (HOSPITAL_COMMUNITY): Payer: Self-pay

## 2024-01-21 ENCOUNTER — Other Ambulatory Visit (HOSPITAL_COMMUNITY): Payer: Self-pay

## 2024-01-22 ENCOUNTER — Other Ambulatory Visit (HOSPITAL_COMMUNITY): Payer: Self-pay

## 2024-01-29 ENCOUNTER — Other Ambulatory Visit (HOSPITAL_COMMUNITY): Payer: Self-pay

## 2024-01-30 ENCOUNTER — Other Ambulatory Visit (HOSPITAL_COMMUNITY): Payer: Self-pay

## 2024-02-01 ENCOUNTER — Other Ambulatory Visit (HOSPITAL_COMMUNITY): Payer: Self-pay

## 2024-02-02 ENCOUNTER — Other Ambulatory Visit (HOSPITAL_COMMUNITY): Payer: Self-pay

## 2024-02-03 ENCOUNTER — Other Ambulatory Visit (HOSPITAL_COMMUNITY): Payer: Self-pay

## 2024-02-06 ENCOUNTER — Other Ambulatory Visit (HOSPITAL_COMMUNITY): Payer: Self-pay

## 2024-02-13 ENCOUNTER — Other Ambulatory Visit (HOSPITAL_COMMUNITY): Payer: Self-pay

## 2024-02-25 ENCOUNTER — Other Ambulatory Visit (HOSPITAL_COMMUNITY): Payer: Self-pay

## 2024-03-05 ENCOUNTER — Other Ambulatory Visit (HOSPITAL_COMMUNITY): Payer: Self-pay

## 2024-03-06 ENCOUNTER — Other Ambulatory Visit (HOSPITAL_COMMUNITY): Payer: Self-pay

## 2024-03-06 MED ORDER — LOSARTAN POTASSIUM-HCTZ 100-12.5 MG PO TABS
1.0000 | ORAL_TABLET | Freq: Every day | ORAL | 0 refills | Status: AC
  Filled 2024-03-06: qty 90, 90d supply, fill #0

## 2024-03-07 ENCOUNTER — Other Ambulatory Visit (HOSPITAL_COMMUNITY): Payer: Self-pay
